# Patient Record
Sex: Male | Born: 1935 | Race: White | Hispanic: No | Marital: Married | Smoking: Former smoker
Health system: Southern US, Community
[De-identification: ages and names within clinical notes are randomized; demographics above are authoritative.]

## PROBLEM LIST (undated history)

## (undated) DIAGNOSIS — H9190 Unspecified hearing loss, unspecified ear: Secondary | ICD-10-CM

## (undated) DIAGNOSIS — M1990 Unspecified osteoarthritis, unspecified site: Secondary | ICD-10-CM

## (undated) DIAGNOSIS — F419 Anxiety disorder, unspecified: Secondary | ICD-10-CM

## (undated) DIAGNOSIS — I10 Essential (primary) hypertension: Secondary | ICD-10-CM

## (undated) DIAGNOSIS — K219 Gastro-esophageal reflux disease without esophagitis: Secondary | ICD-10-CM

## (undated) DIAGNOSIS — E785 Hyperlipidemia, unspecified: Secondary | ICD-10-CM

## (undated) DIAGNOSIS — I499 Cardiac arrhythmia, unspecified: Secondary | ICD-10-CM

## (undated) DIAGNOSIS — R55 Syncope and collapse: Secondary | ICD-10-CM

## (undated) DIAGNOSIS — Z950 Presence of cardiac pacemaker: Secondary | ICD-10-CM

## (undated) HISTORY — PX: TOOTH EXTRACTION: SUR596

## (undated) HISTORY — PX: CHOLECYSTECTOMY: SHX55

---

## 2003-10-22 ENCOUNTER — Other Ambulatory Visit: Payer: Self-pay

## 2005-05-06 ENCOUNTER — Other Ambulatory Visit: Payer: Self-pay

## 2005-05-06 ENCOUNTER — Emergency Department: Payer: Self-pay | Admitting: Unknown Physician Specialty

## 2005-09-14 ENCOUNTER — Emergency Department: Payer: Self-pay | Admitting: Emergency Medicine

## 2006-07-26 ENCOUNTER — Ambulatory Visit: Payer: Self-pay | Admitting: Gastroenterology

## 2006-08-25 ENCOUNTER — Ambulatory Visit: Payer: Self-pay | Admitting: Internal Medicine

## 2006-09-06 ENCOUNTER — Ambulatory Visit: Payer: Self-pay | Admitting: Gastroenterology

## 2007-04-12 ENCOUNTER — Ambulatory Visit: Payer: Self-pay | Admitting: Internal Medicine

## 2007-07-24 ENCOUNTER — Ambulatory Visit: Payer: Self-pay | Admitting: Unknown Physician Specialty

## 2007-08-06 ENCOUNTER — Ambulatory Visit: Payer: Self-pay | Admitting: Urology

## 2007-08-06 ENCOUNTER — Other Ambulatory Visit: Payer: Self-pay

## 2007-08-12 ENCOUNTER — Ambulatory Visit: Payer: Self-pay | Admitting: Urology

## 2009-03-08 ENCOUNTER — Inpatient Hospital Stay: Payer: Self-pay | Admitting: Internal Medicine

## 2009-03-08 ENCOUNTER — Ambulatory Visit: Payer: Self-pay | Admitting: Family Medicine

## 2009-03-26 ENCOUNTER — Ambulatory Visit: Payer: Self-pay | Admitting: Internal Medicine

## 2009-07-27 ENCOUNTER — Ambulatory Visit: Payer: Self-pay | Admitting: Internal Medicine

## 2011-10-18 ENCOUNTER — Ambulatory Visit: Payer: Self-pay | Admitting: Internal Medicine

## 2012-06-05 ENCOUNTER — Ambulatory Visit: Payer: Self-pay | Admitting: Ophthalmology

## 2014-03-22 ENCOUNTER — Emergency Department: Payer: Self-pay | Admitting: Emergency Medicine

## 2014-03-22 ENCOUNTER — Ambulatory Visit: Payer: Self-pay

## 2014-03-22 LAB — CBC WITH DIFFERENTIAL/PLATELET
Basophil #: 0 10*3/uL (ref 0.0–0.1)
Basophil %: 0.1 %
EOS ABS: 0 10*3/uL (ref 0.0–0.7)
Eosinophil %: 0 %
HCT: 40.5 % (ref 40.0–52.0)
HGB: 13.3 g/dL (ref 13.0–18.0)
Lymphocyte #: 0.6 10*3/uL — ABNORMAL LOW (ref 1.0–3.6)
Lymphocyte %: 2.2 %
MCH: 29.8 pg (ref 26.0–34.0)
MCHC: 32.8 g/dL (ref 32.0–36.0)
MCV: 91 fL (ref 80–100)
MONOS PCT: 5.2 %
Monocyte #: 1.3 x10 3/mm — ABNORMAL HIGH (ref 0.2–1.0)
Neutrophil #: 23 10*3/uL — ABNORMAL HIGH (ref 1.4–6.5)
Neutrophil %: 92.5 %
PLATELETS: 186 10*3/uL (ref 150–440)
RBC: 4.45 10*6/uL (ref 4.40–5.90)
RDW: 15 % — AB (ref 11.5–14.5)
WBC: 24.9 10*3/uL — AB (ref 3.8–10.6)

## 2014-03-22 LAB — HEPATIC FUNCTION PANEL A (ARMC)
ALBUMIN: 3.1 g/dL — AB (ref 3.4–5.0)
ALK PHOS: 123 U/L — AB
AST: 211 U/L — AB (ref 15–37)
Bilirubin, Direct: 2.4 mg/dL — ABNORMAL HIGH (ref 0.00–0.20)
Bilirubin,Total: 3.7 mg/dL — ABNORMAL HIGH (ref 0.2–1.0)
SGPT (ALT): 201 U/L — ABNORMAL HIGH (ref 12–78)
Total Protein: 7.4 g/dL (ref 6.4–8.2)

## 2014-03-22 LAB — URINALYSIS, COMPLETE: WBC UR: >30 /HPF (ref 0–5)

## 2014-03-22 LAB — BASIC METABOLIC PANEL
ANION GAP: 10 (ref 7–16)
BUN: 25 mg/dL — ABNORMAL HIGH (ref 7–18)
CALCIUM: 9 mg/dL (ref 8.5–10.1)
Chloride: 97 mmol/L — ABNORMAL LOW (ref 98–107)
Co2: 23 mmol/L (ref 21–32)
Creatinine: 2.42 mg/dL — ABNORMAL HIGH (ref 0.60–1.30)
EGFR (African American): 28 — ABNORMAL LOW
EGFR (Non-African Amer.): 24 — ABNORMAL LOW
GLUCOSE: 125 mg/dL — AB (ref 65–99)
OSMOLALITY: 267 (ref 275–301)
Potassium: 4.5 mmol/L (ref 3.5–5.1)
Sodium: 130 mmol/L — ABNORMAL LOW (ref 136–145)

## 2014-03-23 LAB — URINE CULTURE

## 2014-03-27 LAB — CULTURE, BLOOD (SINGLE)

## 2014-03-28 ENCOUNTER — Ambulatory Visit: Payer: Self-pay | Admitting: Emergency Medicine

## 2015-03-08 ENCOUNTER — Ambulatory Visit (INDEPENDENT_AMBULATORY_CARE_PROVIDER_SITE_OTHER)
Admission: EM | Admit: 2015-03-08 | Discharge: 2015-03-08 | Disposition: A | Discharge status: Home / Self Care | Payer: Medicare Other | Source: Home / Self Care | Attending: Family Medicine | Admitting: Family Medicine

## 2015-03-08 ENCOUNTER — Emergency Department
Admission: EM | Admit: 2015-03-08 | Discharge: 2015-03-08 | Disposition: A | Discharge status: Home / Self Care | Payer: Medicare Other | Attending: Emergency Medicine | Admitting: Emergency Medicine

## 2015-03-08 ENCOUNTER — Other Ambulatory Visit: Payer: Self-pay

## 2015-03-08 ENCOUNTER — Encounter: Payer: Self-pay | Admitting: General Practice

## 2015-03-08 ENCOUNTER — Emergency Department: Payer: Medicare Other

## 2015-03-08 DIAGNOSIS — Y998 Other external cause status: Secondary | ICD-10-CM | POA: Diagnosis not present

## 2015-03-08 DIAGNOSIS — W19XXXA Unspecified fall, initial encounter: Secondary | ICD-10-CM

## 2015-03-08 DIAGNOSIS — R55 Syncope and collapse: Secondary | ICD-10-CM | POA: Insufficient documentation

## 2015-03-08 DIAGNOSIS — Z87891 Personal history of nicotine dependence: Secondary | ICD-10-CM

## 2015-03-08 DIAGNOSIS — R42 Dizziness and giddiness: Secondary | ICD-10-CM

## 2015-03-08 DIAGNOSIS — Z791 Long term (current) use of non-steroidal anti-inflammatories (NSAID): Secondary | ICD-10-CM | POA: Diagnosis not present

## 2015-03-08 DIAGNOSIS — K219 Gastro-esophageal reflux disease without esophagitis: Secondary | ICD-10-CM

## 2015-03-08 DIAGNOSIS — Y9289 Other specified places as the place of occurrence of the external cause: Secondary | ICD-10-CM | POA: Insufficient documentation

## 2015-03-08 DIAGNOSIS — I10 Essential (primary) hypertension: Secondary | ICD-10-CM | POA: Insufficient documentation

## 2015-03-08 DIAGNOSIS — Y9389 Activity, other specified: Secondary | ICD-10-CM | POA: Insufficient documentation

## 2015-03-08 DIAGNOSIS — R7989 Other specified abnormal findings of blood chemistry: Secondary | ICD-10-CM | POA: Diagnosis not present

## 2015-03-08 DIAGNOSIS — E785 Hyperlipidemia, unspecified: Secondary | ICD-10-CM

## 2015-03-08 DIAGNOSIS — S01511A Laceration without foreign body of lip, initial encounter: Secondary | ICD-10-CM | POA: Insufficient documentation

## 2015-03-08 DIAGNOSIS — F419 Anxiety disorder, unspecified: Secondary | ICD-10-CM

## 2015-03-08 DIAGNOSIS — S0083XA Contusion of other part of head, initial encounter: Secondary | ICD-10-CM | POA: Diagnosis not present

## 2015-03-08 DIAGNOSIS — W01198A Fall on same level from slipping, tripping and stumbling with subsequent striking against other object, initial encounter: Secondary | ICD-10-CM | POA: Diagnosis not present

## 2015-03-08 DIAGNOSIS — Z79899 Other long term (current) drug therapy: Secondary | ICD-10-CM | POA: Insufficient documentation

## 2015-03-08 DIAGNOSIS — Z23 Encounter for immunization: Secondary | ICD-10-CM | POA: Insufficient documentation

## 2015-03-08 HISTORY — DX: Hyperlipidemia, unspecified: E78.5

## 2015-03-08 HISTORY — DX: Essential (primary) hypertension: I10

## 2015-03-08 HISTORY — DX: Anxiety disorder, unspecified: F41.9

## 2015-03-08 HISTORY — DX: Gastro-esophageal reflux disease without esophagitis: K21.9

## 2015-03-08 LAB — BASIC METABOLIC PANEL
Anion gap: 7 (ref 5–15)
BUN: 17 mg/dL (ref 6–20)
CALCIUM: 9.1 mg/dL (ref 8.9–10.3)
CO2: 29 mmol/L (ref 22–32)
Chloride: 99 mmol/L — ABNORMAL LOW (ref 101–111)
Creatinine, Ser: 1.53 mg/dL — ABNORMAL HIGH (ref 0.61–1.24)
GFR, EST AFRICAN AMERICAN: 47 mL/min — AB (ref >60)
GFR, EST NON AFRICAN AMERICAN: 40 mL/min — AB (ref >60)
Glucose, Bld: 110 mg/dL — ABNORMAL HIGH (ref 65–99)
POTASSIUM: 4.3 mmol/L (ref 3.5–5.1)
SODIUM: 135 mmol/L (ref 135–145)

## 2015-03-08 LAB — CBC
HEMATOCRIT: 42.4 % (ref 40.0–52.0)
Hemoglobin: 13.8 g/dL (ref 13.0–18.0)
MCH: 28 pg (ref 26.0–34.0)
MCHC: 32.5 g/dL (ref 32.0–36.0)
MCV: 86.3 fL (ref 80.0–100.0)
Platelets: 260 10*3/uL (ref 150–440)
RBC: 4.91 MIL/uL (ref 4.40–5.90)
RDW: 15.7 % — ABNORMAL HIGH (ref 11.5–14.5)
WBC: 11.6 10*3/uL — ABNORMAL HIGH (ref 3.8–10.6)

## 2015-03-08 LAB — TROPONIN I
TROPONIN I: 0.05 ng/mL — AB (ref <0.031)
Troponin I: 0.05 ng/mL — ABNORMAL HIGH (ref <0.031)

## 2015-03-08 MED ORDER — TETANUS-DIPHTH-ACELL PERTUSSIS 5-2.5-18.5 LF-MCG/0.5 IM SUSP
INTRAMUSCULAR | Status: AC
  Administered 2015-03-08: 0.5 mL via INTRAMUSCULAR
  Filled 2015-03-08: qty 0.5

## 2015-03-08 MED ORDER — LIDOCAINE-EPINEPHRINE (PF) 1 %-1:200000 IJ SOLN
INTRAMUSCULAR | Status: AC
  Filled 2015-03-08: qty 30

## 2015-03-08 MED ORDER — CHLORHEXIDINE GLUCONATE 0.12 % MT SOLN: 15.0000 mL | Freq: Two times a day (BID) | OROMUCOSAL | Status: DC

## 2015-03-08 MED ORDER — TETANUS-DIPHTHERIA TOXOIDS TD 5-2 LFU IM INJ: 0.5000 mL | INJECTION | Freq: Once | INTRAMUSCULAR | Status: AC

## 2015-03-08 MED ORDER — LIDOCAINE-EPINEPHRINE 2 %-1:100000 IJ SOLN
20.0000 mL | Freq: Once | INTRAMUSCULAR | Status: DC
  Filled 2015-03-08: qty 20

## 2015-03-08 NOTE — ED Notes (Signed)
Patient had a syncope episode this morning at home. Felt dizzy and passed out, fell face forward on hardwood floor, grandson helped him get up, lip laceration noted and patient reports left shoulder pain. Pt alert oriented x 4.

## 2015-03-08 NOTE — ED Notes (Signed)
Ambulance at bedside.

## 2015-03-08 NOTE — ED Notes (Signed)
Repeat troponin sent to lab to process.

## 2015-03-08 NOTE — ED Notes (Signed)
MD at bedside. 

## 2015-03-08 NOTE — Discharge Instructions (Signed)
Absorbable Suture Repair °Absorbable sutures (stitches) hold skin together so you can heal. Keep skin wounds clean and dry for the next 2 to 3 days. Then, you may gently wash your wound and dress it with an antibiotic ointment as recommended. As your wound begins to heal, the sutures are no longer needed, and they typically begin to fall off. This will take 7 to 10 days. After 10 days, if your sutures are loose, you can remove them by wiping with a clean gauze pad or a cotton ball. Do not pull your sutures out. They should wipe away easily. If after 10 days they do not easily wipe away, have your caregiver take them out. Absorbable sutures may be used deep in a wound to help hold it together. If these stitches are below the skin, the body will absorb them completely in 3 to 4 weeks.  °You may need a tetanus shot if: °· You cannot remember when you had your last tetanus shot. °· You have never had a tetanus shot. °If you get a tetanus shot, your arm may swell, get red, and feel warm to the touch. This is common and not a problem. If you need a tetanus shot and you choose not to have one, there is a rare chance of getting tetanus. Sickness from tetanus can be serious. °SEEK IMMEDIATE MEDICAL CARE IF: °· You have redness in the wound area. °· The wound area feels hot to the touch. °· You develop swelling in the wound area. °· You develop pain. °· There is fluid drainage from the wound. °Document Released: 11/16/2004 Document Revised: 01/01/2012 Document Reviewed: 02/28/2011 °ExitCare® Patient Information ©2015 [employer]. This information is not intended to replace advice given to you by your health care provider. Make sure you discuss any questions you have with your health care provider. ° °Facial Laceration °A facial laceration is a cut on the face. These injuries can be painful and cause bleeding. Some cuts may need to be closed with stitches (sutures), skin adhesive strips, or wound glue. Cuts usually heal  quickly but can leave a scar. It can take 1-2 years for the scar to go away completely. °HOME CARE  °· Only take medicines as told by your doctor. °· Follow your doctor's instructions for wound care. °For Stitches: °· Keep the cut clean and dry. °· If you have a bandage (dressing), change it at least once a day. Change the bandage if it gets wet or dirty, or as told by your doctor. °· Wash the cut with soap and water 2 times a day. Rinse the cut with water. Pat it dry with a clean towel. °· Put a thin layer of medicated cream on the cut as told by your doctor. °· You may shower after the first 24 hours. Do not soak the cut in water until the stitches are removed. °· Have your stitches removed as told by your doctor. °· Do not wear any makeup until a few days after your stitches are removed. °For Skin Adhesive Strips: °· Keep the cut clean and dry. °· Do not get the strips wet. You may take a bath, but be careful to keep the cut dry. °· If the cut gets wet, pat it dry with a clean towel. °· The strips will fall off on their own. Do not remove the strips that are still stuck to the cut. °For Wound Glue: °· You may shower or take baths. Do not soak or scrub the cut. Do   not swim. Avoid heavy sweating until the glue falls off on its own. After a shower or bath, pat the cut dry with a clean towel.  Do not put medicine or makeup on your cut until the glue falls off.  If you have a bandage, do not put tape over the glue.  Avoid lots of sunlight or tanning lamps until the glue falls off.  The glue will fall off on its own in 5-10 days. Do not pick at the glue. After Healing: Put sunscreen on the cut for the first year to reduce your scar. GET HELP RIGHT AWAY IF:   Your cut area gets red, painful, or puffy (swollen).  You see a yellowish-white fluid (pus) coming from the cut.  You have chills or a fever. MAKE SURE YOU:   Understand these instructions.  Will watch your condition.  Will get help right  away if you are not doing well or get worse. Document Released: 03/27/2008 Document Revised: 07/30/2013 Document Reviewed: 05/22/2013 [facility] Patient Information 2015 [employer], [place]. This information is not intended to replace advice given to you by your health care provider. Make sure you discuss any questions you have with your health care provider.  Syncope Syncope is a medical term for fainting or passing out. This means you lose consciousness and drop to the ground. People are generally unconscious for less than 5 minutes. You may have some muscle twitches for up to 15 seconds before waking up and returning to normal. Syncope occurs more often in older adults, but it can happen to anyone. While most causes of syncope are not dangerous, syncope can be a sign of a serious medical problem. It is important to seek medical care.  CAUSES  Syncope is caused by a sudden drop in blood flow to the brain. The specific cause is often not determined. Factors that can bring on syncope include:  Taking medicines that lower blood pressure.  Sudden changes in posture, such as standing up quickly.  Taking more medicine than prescribed.  Standing in one place for too long.  Seizure disorders.  Dehydration and excessive exposure to heat.  Low blood sugar (hypoglycemia).  Straining to have a bowel movement.  Heart disease, irregular heartbeat, or other circulatory problems.  Fear, emotional distress, seeing blood, or severe pain. SYMPTOMS  Right before fainting, you may:  Feel dizzy or light-headed.  Feel nauseous.  See all white or all black in your field of vision.  Have cold, clammy skin. DIAGNOSIS  Your health care provider will ask about your symptoms, perform a physical exam, and perform an electrocardiogram (ECG) to record the electrical activity of your heart. Your health care provider may also perform other heart or blood tests to determine the cause of your syncope which may  include:  Transthoracic echocardiogram (TTE). During echocardiography, sound waves are used to evaluate how blood flows through your heart.  Transesophageal echocardiogram (TEE).  Cardiac monitoring. This allows your health care provider to monitor your heart rate and rhythm in real time.  Holter monitor. This is a portable device that records your heartbeat and can help diagnose heart arrhythmias. It allows your health care provider to track your heart activity for several days, if needed.  Stress tests by exercise or by giving medicine that makes the heart beat faster. TREATMENT  In most cases, no treatment is needed. Depending on the cause of your syncope, your health care provider may recommend changing or stopping some of your medicines. HOME CARE INSTRUCTIONS  Have someone stay with you until you feel stable.  Do not drive, use machinery, or play sports until your health care provider says it is okay.  Keep all follow-up appointments as directed by your health care provider.  Lie down right away if you start feeling like you might faint. Breathe deeply and steadily. Wait until all the symptoms have passed.  Drink enough fluids to keep your urine clear or pale yellow.  If you are taking blood pressure or heart medicine, get up slowly and take several minutes to sit and then stand. This can reduce dizziness. SEEK IMMEDIATE MEDICAL CARE IF:   You have a severe headache.  You have unusual pain in the chest, abdomen, or back.  You are bleeding from your mouth or rectum, or you have black or tarry stool.  You have an irregular or very fast heartbeat.  You have pain with breathing.  You have repeated fainting or seizure-like jerking during an episode.  You faint when sitting or lying down.  You have confusion.  You have trouble walking.  You have severe weakness.  You have vision problems. If you fainted, call your local emergency services (911 in [place] Do not drive  yourself to the hospital.  MAKE SURE YOU:  Understand these instructions.  Will watch your condition.  Will get help right away if you are not doing well or get worse. Document Released: 10/09/2005 Document Revised: 10/14/2013 Document Reviewed: 12/08/2011 [facility] Patient Information 2015 [place]ExitCare, [place]. This information is not intended to replace advice given to you by your health care provider. Make sure you discuss any questions you have with your health care provider.

## 2015-03-08 NOTE — ED Provider Notes (Signed)
[facility] Emergency Department Provider Note  ____________________________________________  Time seen: 2:00 PM  I have reviewed the triage vital signs and the nursing notes.   HISTORY  Chief Complaint Loss of Consciousness and Facial Laceration    HPI Phillip Robinson is a 81 y.o. male who was in the process of preparing his breakfast this morning when he was feeling a little bit dizzy while seated. He then stood up to get toast and got more dizzy and passed out. As he fell he hit the front of his face on the countertop. Denies any chest pain, shortness of breath or other symptoms. He was otherwise in his usual state of health prior to the dizziness and fall. He does have a history of syncope in the past which is been worked up with hospitalizations, most recently 2 months ago at [facility]. Per the patient and wife, they have not yet found any cause for the syncope and he has not been diagnosed with any cardiac issues. No numbness, tingling or weakness, vision changes, or change in balance or coordination  He does complain of pain in the upper lip where there is a laceration   Past Medical History  Diagnosis Date  . Hyperlipidemia   . Anxiety   . Hypertension   . GERD (gastroesophageal reflux disease)     There are no active problems to display for this patient.   No past surgical history on file.  Current Outpatient Rx  Name  Route  Sig  Dispense  Refill  . amLODipine (NORVASC) 5 MG tablet   Oral   Take 5 mg by mouth daily.         . cetirizine (ZYRTEC) 10 MG tablet   Oral   Take 10 mg by mouth daily.         . divalproex (DEPAKOTE) 125 MG DR tablet   Oral   Take 125 mg by mouth 2 (two) times daily.         Marland Kitchen esomeprazole (NEXIUM) 20 MG capsule   Oral   Take 20 mg by mouth daily at 12 noon.         . gabapentin (NEURONTIN) 100 MG capsule   Oral   Take 100 mg by mouth 2 (two) times daily.         Marland Kitchen LORazepam (ATIVAN) 0.5 MG tablet    Oral   Take 0.5 mg by mouth every 6 (six) hours as needed for anxiety.         . meloxicam (MOBIC) 15 MG tablet   Oral   Take 15 mg by mouth daily.         . simvastatin (ZOCOR) 40 MG tablet   Oral   Take 40 mg by mouth daily.           Allergies Review of patient's allergies indicates no known allergies.  No family history on file.  Social History History  Substance Use Topics  . Smoking status: Former Games developer  . Smokeless tobacco: Not on file  . Alcohol Use: No    Review of Systems  Constitutional: No fever or chills. No weight changes Eyes:No blurry vision or double vision.  ENT: No sore throat. Cardiovascular: No chest pain. Respiratory: No dyspnea or cough. Gastrointestinal: Negative for abdominal pain, vomiting and diarrhea.  No BRBPR or melena. Genitourinary: Negative for dysuria, urinary retention, bloody urine, or difficulty urinating. Musculoskeletal: Negative for back pain. No joint swelling or pain. Skin: Negative for rash. Neurological: Negative  for headaches, focal weakness or numbness. Dizziness as above Psychiatric:No anxiety or depression.   Endocrine:No hot/cold intolerance, changes in energy, or sleep difficulty.  10-point ROS otherwise negative.  ____________________________________________   PHYSICAL EXAM:  VITAL SIGNS: ED Triage Vitals  Enc Vitals Group     BP 03/08/15 1217 144/84 mmHg     Pulse Rate 03/08/15 1217 82     Resp 03/08/15 1217 18     Temp 03/08/15 1217 97.4 F (36.3 C)     Temp Source 03/08/15 1217 Oral     SpO2 03/08/15 1217 97 %     Weight 03/08/15 1217 165 lb (74.844 kg)     Height 03/08/15 1217 5\' 7"  (1.702 m)     Head Cir --      Peak Flow --      Pain Score 03/08/15 1218 5     Pain Loc --      Pain Edu? --      Excl. in GC? --      Constitutional: Alert and oriented. Well appearing and in no distress. Eyes: No scleral icterus. No conjunctival pallor. PERRL. EOMI ENT   Head: Normocephalic with a  3 cm laceration of the left upper lip through the vermilion border and into the intraoral lip   Nose: No congestion/rhinnorhea. No septal hematoma   Mouth/Throat: MMM, no pharyngeal erythema. No peritonsillar mass. No uvula shift.   Neck: No stridor. No SubQ emphysema. No meningismus. Mild tenderness in cervical spine around C5 Hematological/Lymphatic/Immunilogical: No cervical lymphadenopathy. Cardiovascular: RRR. Normal and symmetric distal pulses are present in all extremities. No murmurs, rubs, or gallops. Respiratory: Normal respiratory effort without tachypnea nor retractions. Breath sounds are clear and equal bilaterally. No wheezes/rales/rhonchi. Gastrointestinal: Soft and nontender. No distention. There is no CVA tenderness.  No rebound, rigidity, or guarding. Genitourinary: deferred Musculoskeletal: Nontender with normal range of motion in all extremities. No joint effusions.  No lower extremity tenderness.  No edema. Neurologic:   Normal speech and language.  CN 2-10 normal. Motor grossly intact. No pronator drift.  Normal gait. No gross focal neurologic deficits are appreciated.  Skin:  Skin is warm, dry and intact. No rash noted.  No petechiae, purpura, or bullae. Psychiatric: Mood and affect are normal. Speech and behavior are normal. Patient exhibits appropriate insight and judgment.  ____________________________________________    LABS (pertinent positives/negatives) (all labs ordered are listed, but only abnormal results are displayed) Labs Reviewed  CBC - Abnormal; Notable for the following:    WBC 11.6 (*)    RDW 15.7 (*)    All other components within normal limits  BASIC METABOLIC PANEL - Abnormal; Notable for the following:    Chloride 99 (*)    Glucose, Bld 110 (*)    Creatinine, Ser 1.53 (*)    GFR calc non Af Amer 40 (*)    GFR calc Af Amer 47 (*)    All other components within normal limits  TROPONIN I - Abnormal; Notable for the following:     Troponin I 0.05 (*)    All other components within normal limits  TROPONIN I   ____________________________________________   EKG  Sinus rhythm rate 84. There is one PAC and a 6 second strip. Left axis, prolonged QRS at 1:30 milliseconds, right bundle branch block. No ischemic changes of ST segments or T waves  ____________________________________________    RADIOLOGY  CT head and cervical spine unremarkable  ____________________________________________   PROCEDURES LACERATION REPAIR Performed by: Sharman CheekSTAFFORD, Milianna Ericsson Authorized  by: Fain Francis Consent: Verbal consent obtained. Risks and benefits: risks, benefits and alternatives were discussed Consent given by: patient Patient identity confirmed: provided demographic data Prepped and Draped in normal sterile fashion Wound explored  Laceration Location: Left upper lip  Laceration Length: 3 cm  No Foreign Bodies seen or palpated  Anesthesia: Regional infraorbital block Local anesthetic: lidocaine 2 % with epinephrine  Anesthetic total: 2 ml  Irrigation method: syringe Amount of cleaning: standard  Skin closure: Monocryl   Number of sutures: 2   Technique: Simple interrupted  Vermilion border repaired and aligned   Patient tolerance: Patient tolerated the procedure well with no immediate complications.  ____________________________________________   INITIAL IMPRESSION / ASSESSMENT AND PLAN / ED COURSE  Pertinent labs & imaging results that were available during my care of the patient were reviewed by me and considered in my medical decision making (see chart for details).  Patient presents with dizziness causing syncope, and minor head trauma resulting in lip laceration. Check labs and CT of the head, neck, given a tetanus shot, as the patient is unable to recall his last one, and do wound care and laceration repair.  ----------------------------------------- 3:41 PM on  03/08/2015 -----------------------------------------  Initial troponin slightly elevated at 0.05. Patient offered admission to rule out cardiac cause due to his dizziness and syncope. However, patient and wife refuses. They've been through this multiple times before and feel that it is unlikely to yield for the results. We therefore agreed to repeat the troponin at a 3 hour interval and if it was not increasing significantly, the patient would be discharged home to follow up with cardiology. I completed a laceration repair, see laceration note for further details. Vital signs have remained unremarkable. While in the ED. ----------------------------------------- 5:09 PM on 03/08/2015 -----------------------------------------  Repeat troponin unchanged. Low suspicion ACS at this time. Will follow-up with cardiology for further evaluation. Vital signs stable. No evidence on telemetry no distress. ____________________________________________   FINAL CLINICAL IMPRESSION(S) / ED DIAGNOSES  Final diagnoses:  None   dizziness. Syncope. Head trauma with lip laceration involving vermilion border Mildly elevated troponin    Sharman CheekPhillip Emiel Kielty, MD 03/08/15 [phone]

## 2015-03-08 NOTE — ED Notes (Signed)
CRITICAL VALUE ALERT  Critical value received:  Troponin   Date of notification:  03/08/15  Time of notification:  1314  Critical value read back:Yes.    Nurse who received alert:  Lennice SitesJill RN   MD notified (1st page):  Dr. Scotty CourtStafford   Time of first page:  n/a  MD notified (2nd page): n/a  Time of second page: n/a  Responding MD:  Dr. Scotty CourtStafford   Time MD responded:  [id number]

## 2015-03-08 NOTE — ED Notes (Signed)
[facility] Reports that pt experienced a syncope episode today and hit head on floor. Pt. Went to urgent care but was sent for further evaluation. PT alert and oriented x three on arrival. PT repots painful lip, laceration noted to top lip, and back ache. Bleeding control at this time.

## 2015-03-08 NOTE — ED Notes (Signed)
[facility]

## 2015-03-08 NOTE — ED Provider Notes (Signed)
CSN: [id number]     Arrival date & time 03/08/15  1020 History   First MD Initiated Contact with Patient 03/08/15 1038     Chief Complaint  Patient presents with  . Loss of Consciousness   (Consider location/radiation/quality/duration/timing/severity/associated sxs/prior Treatment) Patient is a 81 y.o. male presenting with syncope. The history is provided by the patient and the spouse. The history is limited by the condition of the patient.  Loss of Consciousness Episode history:  Multiple Most recent episode:  Today Timing:  Constant Progression:  Partially resolved Chronicity:  Chronic Context: normal activity and standing up   Context: not blood draw, not inactivity and not sitting down   Witnessed: yes   Relieved by:  Nothing Worsened by:  Nothing tried Associated symptoms: recent fall and recent injury   Risk factors comment:  Abdominal EKG and history of vagal reactions   Past Medical History  Diagnosis Date  . Hyperlipidemia   . Anxiety   . Hypertension   . GERD (gastroesophageal reflux disease)    No past surgical history on file. No family history on file. History  Substance Use Topics  . Smoking status: Former Games developermoker  . Smokeless tobacco: Not on file  . Alcohol Use: No    Review of Systems  HENT: Positive for facial swelling.   Cardiovascular: Positive for syncope.  Gastrointestinal: Positive for abdominal distention.       Spouse reports that he has vagal episodes from gastric distention.  Musculoskeletal: Positive for myalgias.       Reports having right shoulder pain  Neurological: Positive for syncope.   Patient was brought here after experiencing a syncopal episode at home. According to his wife he was walking when he went out apparently his face on furniture at home resulting in a bloody lip bloody mouth he's also complaining of left shoulder pain as well. His wife reports that he's had a history of syncopal episodes in the last time he [facility].  His wife reports that he was out for several minutes but this is usual for him. Patient is not very talkative at this time. The patient's wife indicates they want the treatment here. [facility] and they do not want to go to [facility].  Further discussion now the patient and his wife indicate that they may be willing to go to [facility].  Allergies  Review of patient's allergies indicates no known allergies.  Home Medications   Prior to Admission medications   Medication Sig Start Date End Date Taking? Authorizing Provider  amLODipine (NORVASC) 5 MG tablet Take 5 mg by mouth daily.   Yes Historical Provider, MD  cetirizine (ZYRTEC) 10 MG tablet Take 10 mg by mouth daily.   Yes Historical Provider, MD  divalproex (DEPAKOTE) 125 MG DR tablet Take 125 mg by mouth 2 (two) times daily.   Yes Historical Provider, MD  esomeprazole (NEXIUM) 20 MG capsule Take 20 mg by mouth daily.    Yes Historical Provider, MD  gabapentin (NEURONTIN) 100 MG capsule Take 100 mg by mouth 2 (two) times daily.   Yes Historical Provider, MD  LORazepam (ATIVAN) 0.5 MG tablet Take 0.5 mg by mouth every 6 (six) hours as needed for anxiety.   Yes Historical Provider, MD  meloxicam (MOBIC) 15 MG tablet Take 15 mg by mouth daily.   Yes Historical Provider, MD  simvastatin (ZOCOR) 40 MG tablet Take 40 mg by mouth daily.   Yes Historical Provider,  MD  chlorhexidine (PERIDEX) 0.12 % solution Use as directed 15 mLs in the mouth or throat 2 (two) times daily. 03/08/15   Sharman CheekPhillip Stafford, MD   BP 138/76 mmHg  Pulse 78  Temp(Src) 97.7 F (36.5 C) (Tympanic)  Resp 16  SpO2 96% Physical Exam  Constitutional: He appears distressed.  HENT:  Head: Normocephalic. Head is with abrasion, with contusion and with laceration.  Mouth/Throat:    Eyes: Pupils are equal, round, and reactive to light.  Cardiovascular: Normal rate and regular rhythm.   Pulmonary/Chest: Effort normal. No  accessory muscle usage. No respiratory distress.  Neurological:   His speech is very soft and passive this far as answering questions. His wife answers most of his questions.  Skin: Skin is warm and dry.  Vitals reviewed.  According to his wife patient patient is oriented. In a soft voice he can tell me the president and where he is at. According to his wife he poor. good hearing.  ED Course  Procedures (including critical care time) Labs Review Labs Reviewed - No data to display Patient and his spouse was informed that with his facial injury that he has suffered abnormal EKG showing both a right bundle branch block and a left bundle branch block or bifascicular block and even though he's had a history of syncopal episodes with the lack of responsiveness that he showing now I recommend that he be transferred to hospital for further evaluation. She is going to talk to her daughter. Imaging Review Ct Head Wo Contrast  03/08/2015   CLINICAL DATA:  Syncope this morning with dizziness and a fall.  EXAM: CT HEAD WITHOUT CONTRAST  CT CERVICAL SPINE WITHOUT CONTRAST  TECHNIQUE: Multidetector CT imaging of the head and cervical spine was performed following the standard protocol without intravenous contrast. Multiplanar CT image reconstructions of the cervical spine were also generated.  COMPARISON:  Brain MRI 10/18/2011.  FINDINGS: CT HEAD FINDINGS  Cortical atrophy is noted. No evidence of acute abnormality including hemorrhage, infarct, mass lesion, mass effect, midline shift or abnormal extra-axial fluid collection is identified. Remote lacunar infarction right basal ganglia is noted. The calvarium is intact. Mild mucosal thickening is seen in the left frontal sinus. Imaged paranasal sinuses and mastoid air cells are otherwise clear. The patient is status post ethmoidectomy.  CT CERVICAL SPINE FINDINGS  No cervical spine fracture is identified. Loss of disc space height and endplate spurring are most  notable it C6-7. Trace anterolisthesis C3 on C4 and 0.2 cm anterolisthesis C5 on C6 due to facet degenerative change.  IMPRESSION: No acute abnormality head or cervical spine.  Cortical atrophy.  Cervical spondylosis with.   Electronically Signed   By: Drusilla Kannerhomas  Dalessio M.D.   On: 03/08/2015 13:23   Ct Cervical Spine Wo Contrast  03/08/2015   CLINICAL DATA:  Syncope this morning with dizziness and a fall.  EXAM: CT HEAD WITHOUT CONTRAST  CT CERVICAL SPINE WITHOUT CONTRAST  TECHNIQUE: Multidetector CT imaging of the head and cervical spine was performed following the standard protocol without intravenous contrast. Multiplanar CT image reconstructions of the cervical spine were also generated.  COMPARISON:  Brain MRI 10/18/2011.  FINDINGS: CT HEAD FINDINGS  Cortical atrophy is noted. No evidence of acute abnormality including hemorrhage, infarct, mass lesion, mass effect, midline shift or abnormal extra-axial fluid collection is identified. Remote lacunar infarction right basal ganglia is noted. The calvarium is intact. Mild mucosal thickening is seen in the left frontal sinus. Imaged paranasal sinuses and mastoid  air cells are otherwise clear. The patient is status post ethmoidectomy.  CT CERVICAL SPINE FINDINGS  No cervical spine fracture is identified. Loss of disc space height and endplate spurring are most notable it C6-7. Trace anterolisthesis C3 on C4 and 0.2 cm anterolisthesis C5 on C6 due to facet degenerative change.  IMPRESSION: No acute abnormality head or cervical spine.  Cortical atrophy.  Cervical spondylosis with.   Electronically Signed   By: Drusilla Kanner M.D.   On: 03/08/2015 13:23     MDM   1. Syncopal vertigo   2. Facial contusion, initial encounter        Hassan Rowan, MD 03/08/15 2054

## 2015-03-08 NOTE — ED Notes (Signed)
Patient and family member informed that patient will be transported to the nearby emergency department for further evaluation of his syncope episode. Pt acknowledged and denied any questions. Emergency Services has been call. Pending for transfer

## 2015-03-19 ENCOUNTER — Ambulatory Visit (INDEPENDENT_AMBULATORY_CARE_PROVIDER_SITE_OTHER): Payer: Medicare Other | Admitting: Cardiovascular Disease

## 2015-03-19 ENCOUNTER — Encounter: Payer: Self-pay | Admitting: Cardiovascular Disease

## 2015-03-19 VITALS — BP 126/78 | HR 73 | Ht 67.0 in | Wt 149.0 lb

## 2015-03-19 DIAGNOSIS — R55 Syncope and collapse: Secondary | ICD-10-CM

## 2015-03-19 DIAGNOSIS — I10 Essential (primary) hypertension: Secondary | ICD-10-CM | POA: Diagnosis not present

## 2015-03-19 NOTE — Patient Instructions (Addendum)
Medication Instructions:  Your physician recommends that you continue on your current medications as directed. Please refer to the Current Medication list given to you today.   Labwork: None  Testing/Procedures: Your physician has recommended that you wear an event monitor. Event monitors are medical devices that record the heart's electrical activity. Doctors most often us these monitors to diagnose arrhythmias. Arrhythmias are problems with the speed or rhythm of the heartbeat. The monitor is a small, portable device. You can wear one while you do your normal daily activities. This is usually used to diagnose what is causing palpitations/syncope (passing out).  Your physician has requested that you have an echocardiogram. Echocardiography is a painless test that uses sound waves to create images of your heart. It provides your doctor with information about the size and shape of your heart and how well your heart's chambers and valves are working. This procedure takes approximately one hour. There are no restrictions for this procedure.    Follow-Up: Your physician recommends that you schedule a follow-up appointment in: six weeks with Dr. Kirke CorinArida.   Any Other Special Instructions Will Be Listed Below (If Applicable).

## 2015-03-19 NOTE — Assessment & Plan Note (Signed)
The patient presents with recurrent syncopal episodes. He is mildly orthostatic. However, the description of the episodes is not suggestive of orthostatic hypotension. The history is worrisome for recurrent bradycardia arrhythmic events especially with his baseline abnormal EKG showing bifascicular block. Due to that, I requested a 30 day outpatient telemetry. I also requested an echocardiogram to ensure no structural heart abnormalities. He has no symptoms suggestive of angina.  Patient is to follow-up after cardiac workup.

## 2015-03-19 NOTE — Assessment & Plan Note (Signed)
Blood pressure is controlled on current medications. 

## 2015-03-19 NOTE — Progress Notes (Signed)
Primary care physician: Dr. Yates Decamp  HPI  This is a pleasant 81 year old male who was referred for evaluation of recurrent syncope. The patient is accompanied by his wife. He is not aware of any previous cardiac history. He has known history of hypertension and hyperlipidemia. He reports prolonged history of recurrent syncopal episodes for a few years which has worsened recently. His wife reports that the patient used to pass out in the chair but the episodes were very infrequent. However, over the last month he had 3 syncopal episodes. The first episode happened while he was sitting in the chair. He was sweating profusely and was cold. The episode lasted for about 1 minute. He had a second episode while he was in the kitchen. It happened while he was walking to get and toast. All of a sudden, he had loss of consciousness and felt with subsequent lip laceration and neck contusion. He went to the emergency room at [facility]. His ECG showed bifascicular block. Troponin was only borderline elevated. The patient was not admitted. He had a third episode while he was sitting in the chair and was associated with vomiting. The patient is hard of hearing. He denies any chest pain or shortness of breath.  Allergies  Allergen Reactions  . Sulfa Antibiotics     Other reaction(s): Unknown     Current Outpatient Prescriptions on File Prior to Visit  Medication Sig Dispense Refill  . amLODipine (NORVASC) 5 MG tablet Take 5 mg by mouth daily.    . cetirizine (ZYRTEC) 10 MG tablet Take 10 mg by mouth daily.    . chlorhexidine (PERIDEX) 0.12 % solution Use as directed 15 mLs in the mouth or throat 2 (two) times daily. 120 mL 0  . divalproex (DEPAKOTE) 125 MG DR tablet Take 125 mg by mouth 2 (two) times daily.    Marland Kitchen esomeprazole (NEXIUM) 20 MG capsule Take 20 mg by mouth daily.     Marland Kitchen gabapentin (NEURONTIN) 100 MG capsule Take 100 mg by mouth 2 (two) times daily.    Marland Kitchen LORazepam (ATIVAN) 0.5 MG tablet Take 0.5 mg  by mouth every 6 (six) hours as needed for anxiety.    . meloxicam (MOBIC) 15 MG tablet Take 15 mg by mouth daily.    . simvastatin (ZOCOR) 40 MG tablet Take 40 mg by mouth daily.     No current facility-administered medications on file prior to visit.     Past Medical History  Diagnosis Date  . Hyperlipidemia   . Anxiety   . Hypertension   . GERD (gastroesophageal reflux disease)      Past Surgical History  Procedure Laterality Date  . Cholecystectomy       Family History  Problem Relation Age of Onset  . Family history unknown: Yes     History   Social History  . Marital Status: Married    Spouse Name: N/A  . Number of Children: N/A  . Years of Education: N/A   Occupational History  . Not on file.   Social History Main Topics  . Smoking status: Former Games developer  . Smokeless tobacco: Not on file  . Alcohol Use: No  . Drug Use: No  . Sexual Activity: Not on file   Other Topics Concern  . Not on file   Social History Narrative     ROS A 10 point review of system was performed. It is negative other than that mentioned in the history of present illness.   PHYSICAL  EXAM   BP 126/78 mmHg  Pulse 73  Ht 5\' 7"  (1.702 m)  Wt 149 lb (67.586 kg)  BMI 23.33 kg/m2 Constitutional: He is oriented to person, place, and time. He appears well-developed and well-nourished. No distress.  HENT: No nasal discharge.  Head: Normocephalic and atraumatic.  Eyes: Pupils are equal and round.  No discharge. Neck: Normal range of motion. Neck supple. No JVD present. No thyromegaly present.  Cardiovascular: Normal rate, regular rhythm, normal heart sounds. Exam reveals no gallop and no friction rub. No murmur heard.  Pulmonary/Chest: Effort normal and breath sounds normal. No stridor. No respiratory distress. He has no wheezes. He has no rales. He exhibits no tenderness.  Abdominal: Soft. Bowel sounds are normal. He exhibits no distension. There is no tenderness. There is no  rebound and no guarding.  Musculoskeletal: Normal range of motion. He exhibits no edema and no tenderness.  Neurological: He is alert and oriented to person, place, and time. Coordination normal.  Skin: Skin is warm and dry. No rash noted. He is not diaphoretic. No erythema. No pallor.  Psychiatric: He has a normal mood and affect. His behavior is normal. Judgment and thought content normal.       EKG: Recent EKG showed normal sinus rhythm with right bundle branch block and left anterior fascicular block.   ASSESSMENT AND PLAN

## 2015-03-26 ENCOUNTER — Ambulatory Visit (INDEPENDENT_AMBULATORY_CARE_PROVIDER_SITE_OTHER): Payer: Medicare Other

## 2015-03-26 ENCOUNTER — Other Ambulatory Visit: Payer: Self-pay

## 2015-03-26 DIAGNOSIS — R55 Syncope and collapse: Secondary | ICD-10-CM

## 2015-04-02 ENCOUNTER — Other Ambulatory Visit: Payer: Self-pay

## 2015-04-02 ENCOUNTER — Inpatient Hospital Stay (HOSPITAL_COMMUNITY)
Admission: AD | Admit: 2015-04-02 | Discharge: 2015-04-06 | DRG: 244 | Disposition: A | Discharge status: Home / Self Care | Payer: Medicare Other | Source: Other Acute Inpatient Hospital | Attending: Dermatopathology | Admitting: Dermatopathology

## 2015-04-02 ENCOUNTER — Emergency Department: Payer: Medicare Other

## 2015-04-02 ENCOUNTER — Encounter: Payer: Self-pay | Admitting: Emergency Medicine

## 2015-04-02 ENCOUNTER — Emergency Department
Admission: EM | Admit: 2015-04-02 | Discharge: 2015-04-02 | Payer: Medicare Other | Attending: Emergency Medicine | Admitting: Emergency Medicine

## 2015-04-02 DIAGNOSIS — Z79899 Other long term (current) drug therapy: Secondary | ICD-10-CM | POA: Diagnosis not present

## 2015-04-02 DIAGNOSIS — H9190 Unspecified hearing loss, unspecified ear: Secondary | ICD-10-CM | POA: Diagnosis present

## 2015-04-02 DIAGNOSIS — Z87891 Personal history of nicotine dependence: Secondary | ICD-10-CM

## 2015-04-02 DIAGNOSIS — Z882 Allergy status to sulfonamides status: Secondary | ICD-10-CM

## 2015-04-02 DIAGNOSIS — Z8249 Family history of ischemic heart disease and other diseases of the circulatory system: Secondary | ICD-10-CM | POA: Diagnosis not present

## 2015-04-02 DIAGNOSIS — R55 Syncope and collapse: Secondary | ICD-10-CM | POA: Diagnosis present

## 2015-04-02 DIAGNOSIS — I459 Conduction disorder, unspecified: Secondary | ICD-10-CM | POA: Diagnosis not present

## 2015-04-02 DIAGNOSIS — Z959 Presence of cardiac and vascular implant and graft, unspecified: Secondary | ICD-10-CM | POA: Diagnosis not present

## 2015-04-02 DIAGNOSIS — I455 Other specified heart block: Secondary | ICD-10-CM | POA: Insufficient documentation

## 2015-04-02 DIAGNOSIS — R001 Bradycardia, unspecified: Secondary | ICD-10-CM | POA: Diagnosis not present

## 2015-04-02 DIAGNOSIS — K219 Gastro-esophageal reflux disease without esophagitis: Secondary | ICD-10-CM | POA: Diagnosis present

## 2015-04-02 DIAGNOSIS — I495 Sick sinus syndrome: Secondary | ICD-10-CM | POA: Diagnosis not present

## 2015-04-02 DIAGNOSIS — E785 Hyperlipidemia, unspecified: Secondary | ICD-10-CM | POA: Diagnosis present

## 2015-04-02 DIAGNOSIS — I452 Bifascicular block: Secondary | ICD-10-CM | POA: Diagnosis present

## 2015-04-02 DIAGNOSIS — I10 Essential (primary) hypertension: Secondary | ICD-10-CM | POA: Insufficient documentation

## 2015-04-02 DIAGNOSIS — I442 Atrioventricular block, complete: Secondary | ICD-10-CM | POA: Diagnosis present

## 2015-04-02 DIAGNOSIS — F419 Anxiety disorder, unspecified: Secondary | ICD-10-CM | POA: Diagnosis present

## 2015-04-02 HISTORY — DX: Unspecified osteoarthritis, unspecified site: M19.90

## 2015-04-02 HISTORY — DX: Cardiac arrhythmia, unspecified: I49.9

## 2015-04-02 HISTORY — DX: Syncope and collapse: R55

## 2015-04-02 HISTORY — DX: Presence of cardiac pacemaker: Z95.0

## 2015-04-02 HISTORY — DX: Unspecified hearing loss, unspecified ear: H91.90

## 2015-04-02 LAB — CBC
HCT: 39.2 % — ABNORMAL LOW (ref 40.0–52.0)
HCT: 38.3 % — ABNORMAL LOW (ref 39.0–52.0)
HEMOGLOBIN: 12.6 g/dL — AB (ref 13.0–18.0)
Hemoglobin: 12.7 g/dL — ABNORMAL LOW (ref 13.0–17.0)
MCH: 28 pg (ref 26.0–34.0)
MCH: 28.5 pg (ref 26.0–34.0)
MCHC: 32.3 g/dL (ref 32.0–36.0)
MCHC: 33.2 g/dL (ref 30.0–36.0)
MCV: 86.8 fL (ref 80.0–100.0)
MCV: 86.1 fL (ref 78.0–100.0)
PLATELETS: 266 10*3/uL (ref 150–440)
Platelets: 248 K/uL (ref 150–400)
RBC: 4.52 MIL/uL (ref 4.40–5.90)
RBC: 4.45 MIL/uL (ref 4.22–5.81)
RDW: 15.6 % — ABNORMAL HIGH (ref 11.5–14.5)
RDW: 14.9 % (ref 11.5–15.5)
WBC: 13.1 10*3/uL — AB (ref 3.8–10.6)
WBC: 15.5 K/uL — ABNORMAL HIGH (ref 4.0–10.5)

## 2015-04-02 LAB — BASIC METABOLIC PANEL
Anion gap: 9 (ref 5–15)
BUN: 18 mg/dL (ref 6–20)
CALCIUM: 9.1 mg/dL (ref 8.9–10.3)
CHLORIDE: 100 mmol/L — AB (ref 101–111)
CO2: 27 mmol/L (ref 22–32)
Creatinine, Ser: 1.5 mg/dL — ABNORMAL HIGH (ref 0.61–1.24)
GFR calc Af Amer: 48 mL/min — ABNORMAL LOW (ref >60)
GFR calc non Af Amer: 41 mL/min — ABNORMAL LOW (ref >60)
GLUCOSE: 114 mg/dL — AB (ref 65–99)
Potassium: 3.8 mmol/L (ref 3.5–5.1)
Sodium: 136 mmol/L (ref 135–145)

## 2015-04-02 LAB — CREATININE, SERUM
CREATININE: 1.39 mg/dL — AB (ref 0.61–1.24)
GFR, EST AFRICAN AMERICAN: 52 mL/min — AB (ref >60)
GFR, EST NON AFRICAN AMERICAN: 45 mL/min — AB (ref >60)

## 2015-04-02 LAB — TROPONIN I: Troponin I: 0.03 ng/mL (ref <0.031)

## 2015-04-02 LAB — MRSA PCR SCREENING: MRSA BY PCR: NEGATIVE

## 2015-04-02 MED ORDER — MELOXICAM 15 MG PO TABS
15.0000 mg | ORAL_TABLET | Freq: Once | ORAL | Status: AC
  Administered 2015-04-02: 15 mg via ORAL
  Filled 2015-04-02: qty 1

## 2015-04-02 MED ORDER — SIMVASTATIN 40 MG PO TABS
40.0000 mg | ORAL_TABLET | Freq: Every day | ORAL | Status: DC
  Administered 2015-04-02 – 2015-04-05 (×4): 40 mg via ORAL
  Filled 2015-04-02 (×6): qty 1

## 2015-04-02 MED ORDER — HEPARIN SODIUM (PORCINE) 5000 UNIT/ML IJ SOLN
5000.0000 [IU] | Freq: Three times a day (TID) | INTRAMUSCULAR | Status: DC
  Administered 2015-04-02 – 2015-04-06 (×11): 5000 [IU] via SUBCUTANEOUS
  Filled 2015-04-02 (×14): qty 1

## 2015-04-02 MED ORDER — PANTOPRAZOLE SODIUM 40 MG PO TBEC
40.0000 mg | DELAYED_RELEASE_TABLET | Freq: Every day | ORAL | Status: DC
  Administered 2015-04-03 – 2015-04-06 (×4): 40 mg via ORAL
  Filled 2015-04-02 (×3): qty 1

## 2015-04-02 MED ORDER — DIVALPROEX SODIUM 125 MG PO DR TAB
125.0000 mg | DELAYED_RELEASE_TABLET | Freq: Two times a day (BID) | ORAL | Status: DC
  Administered 2015-04-02 – 2015-04-06 (×8): 125 mg via ORAL
  Filled 2015-04-02 (×10): qty 1

## 2015-04-02 MED ORDER — GABAPENTIN 100 MG PO CAPS
100.0000 mg | ORAL_CAPSULE | Freq: Two times a day (BID) | ORAL | Status: DC
  Administered 2015-04-02 – 2015-04-06 (×8): 100 mg via ORAL
  Filled 2015-04-02 (×10): qty 1

## 2015-04-02 MED ORDER — MELOXICAM 15 MG PO TABS
15.0000 mg | ORAL_TABLET | Freq: Every day | ORAL | Status: DC
  Administered 2015-04-03 – 2015-04-06 (×4): 15 mg via ORAL
  Filled 2015-04-02 (×4): qty 1

## 2015-04-02 MED ORDER — LORAZEPAM 0.5 MG PO TABS: 0.5000 mg | ORAL_TABLET | Freq: Two times a day (BID) | ORAL | Status: DC | PRN

## 2015-04-02 NOTE — ED Provider Notes (Signed)
[facility] Emergency Department Provider Note  Time seen: 3:35 PM  I have reviewed the triage vital signs and the nursing notes.   HISTORY  Chief Complaint Loss of Consciousness    HPI Phillip Robinson is a 81 y.o. male with a past medical history of hyperlipidemia, anxiety, hypertension, gastric reflux who presents the emergency department after a syncopal episode 2. According to the wife the patient has been complaining of some gas today. He was walking outside when he had a syncopal episode and fell to the ground. They helped him up to a chair and he had a second syncopal episode so they lied him down on the ground and called 911. Patient denies any chest pain at any time, or shortness of breath. The patient does not recall falling to the ground which is somewhat abnormal for the patient. According to the wife for the past 1-2 years the patient has been having frequent syncopal episodes which have been increasing in frequency recently. The patient saw Dr. Kirke Corin and is currently wearing a Holter monitor.Wife states they only came to the hospital because he passed out twice, otherwise they would not come. They are not sure if the patient hit his head. Currently patient denies any symptoms, feels normal.    Past Medical History  Diagnosis Date  . Hyperlipidemia   . Anxiety   . Hypertension   . GERD (gastroesophageal reflux disease)     Patient Active Problem List   Diagnosis Date Noted  . Faintness 03/19/2015  . Essential hypertension 03/19/2015    Past Surgical History  Procedure Laterality Date  . Cholecystectomy      Current Outpatient Rx  Name  Route  Sig  Dispense  Refill  . amLODipine (NORVASC) 5 MG tablet   Oral   Take 5 mg by mouth daily.         . cetirizine (ZYRTEC) 10 MG tablet   Oral   Take 10 mg by mouth daily.         . chlorhexidine (PERIDEX) 0.12 % solution   Mouth/Throat   Use as directed 15 mLs in the mouth or throat 2  (two) times daily.   120 mL   0   . divalproex (DEPAKOTE) 125 MG DR tablet   Oral   Take 125 mg by mouth 2 (two) times daily.         Marland Kitchen esomeprazole (NEXIUM) 20 MG capsule   Oral   Take 20 mg by mouth daily.          Marland Kitchen gabapentin (NEURONTIN) 100 MG capsule   Oral   Take 100 mg by mouth 2 (two) times daily.         Marland Kitchen LORazepam (ATIVAN) 0.5 MG tablet   Oral   Take 0.5 mg by mouth every 6 (six) hours as needed for anxiety.         . meloxicam (MOBIC) 15 MG tablet   Oral   Take 15 mg by mouth daily.         . simvastatin (ZOCOR) 40 MG tablet   Oral   Take 40 mg by mouth daily.           Allergies Sulfa antibiotics  Family History  Problem Relation Age of Onset  . Family history unknown: Yes    Social History History  Substance Use Topics  . Smoking status: Former Games developer  . Smokeless tobacco: Not on file  . Alcohol Use: No  Review of Systems Constitutional: Negative for fever. Positive for syncope Cardiovascular: Negative for chest pain. Respiratory: Negative for shortness of breath. Gastrointestinal: Negative for abdominal pain, vomiting and diarrhea. Neurological: Negative for headaches, focal weakness or numbness. 10-point ROS otherwise negative.  ____________________________________________   PHYSICAL EXAM:  VITAL SIGNS: ED Triage Vitals  Enc Vitals Group     BP 04/02/15 1525 149/74 mmHg     Pulse Rate 04/02/15 1525 81     Resp 04/02/15 1525 18     Temp 04/02/15 1525 97.6 F (36.4 C)     Temp Source 04/02/15 1525 Oral     SpO2 04/02/15 1525 98 %     Weight --      Height 04/02/15 1525  (1.803 m)     Head Cir --      Peak Flow --      Pain Score 04/02/15 1525 0     Pain Loc --      Pain Edu? --      Excl. in GC? --     Constitutional: Alert and oriented. Well appearing and in no distress. ENT   Head: Normocephalic and atraumatic   Mouth/Throat: Mucous membranes are moist. Cardiovascular: Normal rate, regular  rhythm. Respiratory: Normal respiratory effort without tachypnea nor retractions. Breath sounds are clear  Gastrointestinal: Soft and nontender. No distention.  There is no CVA tenderness. Musculoskeletal: Nontender with normal range of motion in all extremities. No lower extremity tenderness or edema. Neurologic:  Normal speech and language. No gross focal neurologic deficits are appreciated. Speech is normal. Skin:  Skin is warm, dry and intact.  Psychiatric: Mood and affect are normal. Speech and behavior are normal.  ____________________________________________    EKG  EKG reviewed and interpreted by myself shows normal sinus rhythm at 78 bpm, narrow QRS, left axis deviation, largely normal intervals, nonspecific ST changes are present, along with a right bundle branch block.  ____________________________________________    RADIOLOGY  CT head within normal limits.  ____________________________________________   INITIAL IMPRESSION / ASSESSMENT AND PLAN / ED COURSE  Pertinent labs & imaging results that were available during my care of the patient were reviewed by me and considered in my medical decision making (see chart for details).  81 year old male presents with syncope. Patient has a history of the same many times within the past 1 year. Currently seeing a cardiologist for workup. There was last seen in the emergency department on 03-18-2015 for a similar event with a negative ER workup. As the patient fell with possible head injury we will proceed with a CT scan to further evaluate as the patient does not recall the event. We will check labs, and closely monitor in the emergency department. Once her workup is complete I will discuss the plan of care with the patient's cardiologist Dr. Kirke Corin.    Nurse practitioner from Dr.Arida's office is in the emergency department currently speaking with the patient. It appears that the patient had a sinus pause evident on his Holter monitor.  They've discussed with [facility] and will arrange transfer to [facility] for consideration of a pacemaker for the patient. Labs are largely within normal limits, with mild white blood cell count elevation and negative troponin.  ____________________________________________   FINAL CLINICAL IMPRESSION(S) / ED DIAGNOSES  Syncope Sinus pause   Minna Antis, MD 04/02/15 1715

## 2015-04-02 NOTE — ED Notes (Signed)
Pt arrived via EMS from home with 2 syncopal episodes. Pt is alert and oriented on arrival. Denies pain , dizziness. Blood sugar 111. Pt is seeing cardiology for this problem and has a hlter monitor.

## 2015-04-02 NOTE — Consult Note (Signed)
CARDIOLOGY CONSULT NOTE   Patient ID: Phillip Robinson MRN: [id number], DOB/AGE: [birth year]   Admit date: 04/02/2015 Date of Consult: 04/02/2015   Primary Physician: Rafael Bihari, MD Primary Cardiologist: Judie Petit. Kirke Corin, MD   Pt. Profile  81 y/o male with a h/o syncope that presented to ED following syncope today.  Problem List  Past Medical History  Diagnosis Date  . Hyperlipidemia   . Anxiety   . Hypertension   . GERD (gastroesophageal reflux disease)   . Syncope     a. 03/2015 echo: EF 55-60%, no rwma, Gr 1 DD, mild MR;  b. 03/2015 Event monitor w/ documented sinus arrest.    Past Surgical History  Procedure Laterality Date  . Cholecystectomy       Allergies  Allergies  Allergen Reactions  . Sulfa Antibiotics     Other reaction(s): Unknown    HPI   81 y/o male with the above problem list. He was recently seen in clinic by Dr. Kirke Corin secondary to recurrent syncope described as sudden loss of consciousness.  Following one occasion, he did suffer a lip laceration and neck contusion following a fall associated with his syncopal spell and was noted to have bifasicular block on his ECG.  Pt was placed on an event monitor following his visit with Dr. Kirke Corin.  Today, he walked into his house from outside and told his wife that his stomach felt upset.  He walked to get medicine and then had sudden syncope witnessed by his wife and son.  They assisted him up and he regained consciousness w/in about 30 seconds but then lost consciousness again once they had placed him in a recliner.  He remained unresponsive for about 2 mins per wife's report and was gurgling.  EMS was called.  By the time they arrived, he had already regained consciousness and dry heaving.  He was also diaphoretic.  He was taken to the ED.  Pt is very HOH and thus interview is carried out with his wife.  Wife says that pt has been confused since the event, frequently asking what had happened.  Here, he is in sinus rhythm  w/o evidence of heart block though he does have a chronic bifasicular block.  He currently has no complaints.  Home Medications  Prior to Admission medications   Medication Sig Start Date End Date Taking? Authorizing Provider  amLODipine (NORVASC) 5 MG tablet Take 5 mg by mouth daily.    Historical Provider, MD  cetirizine (ZYRTEC) 10 MG tablet Take 10 mg by mouth daily.    Historical Provider, MD  chlorhexidine (PERIDEX) 0.12 % solution Use as directed 15 mLs in the mouth or throat 2 (two) times daily. 03/08/15   Sharman Cheek, MD  divalproex (DEPAKOTE) 125 MG DR tablet Take 125 mg by mouth 2 (two) times daily.    Historical Provider, MD  esomeprazole (NEXIUM) 20 MG capsule Take 20 mg by mouth daily.     Historical Provider, MD  gabapentin (NEURONTIN) 100 MG capsule Take 100 mg by mouth 2 (two) times daily.    Historical Provider, MD  LORazepam (ATIVAN) 0.5 MG tablet Take 0.5 mg by mouth every 6 (six) hours as needed for anxiety.    Historical Provider, MD  meloxicam (MOBIC) 15 MG tablet Take 15 mg by mouth daily.    Historical Provider, MD  simvastatin (ZOCOR) 40 MG tablet Take 40 mg by mouth daily.    Historical Provider, MD     Family History  Family History  Problem Relation Age of Onset  . CAD Brother   . Heart failure Brother   . Heart attack Father     died @ 20.     Social History History   Social History  . Marital Status: Married    Spouse Name: N/A  . Number of Children: N/A  . Years of Education: N/A   Occupational History  . Not on file.   Social History Main Topics  . Smoking status: Former Games developer  . Smokeless tobacco: Not on file  . Alcohol Use: No  . Drug Use: No  . Sexual Activity: Not on file   Other Topics Concern  . Not on file   Social History Narrative   Lives in [place] with wife.  Very sedentary.  Very HOH/deaf.     Review of Systems  Pt very HOH and unable to contribute to interview.  Wife says that other than syncope, he has not had any  recent complaints.  Physical Exam  Blood pressure 149/74, pulse 81, temperature 97.6 F (36.4 C), temperature source Oral, resp. rate 18, height 5\' 11"  (1.803 m), weight 152 lb (68.947 kg), SpO2 98 %.  General: Pleasant, NAD Psych: Normal affect. Neuro: Alert and oriented X 3. Moves all extremities spontaneously. HEENT: Normal  Neck: Supple without bruits or JVD. Lungs:  Resp regular and unlabored, CTA. Heart: RRR no s3, s4, or murmurs. Abdomen: Soft, non-tender, non-distended, BS + x 4.  Extremities: No clubbing, cyanosis or edema. DP/PT/Radials 2+ and equal bilaterally.  Labs   Recent Labs  04/02/15 1541  TROPONINI 0.03   Lab Results  Component Value Date   WBC 13.1* 04/02/2015   HGB 12.6* 04/02/2015   HCT 39.2* 04/02/2015   MCV 86.8 04/02/2015   PLT 266 04/02/2015     Recent Labs Lab 04/02/15 1541  NA 136  K 3.8  CL 100*  CO2 27  BUN 18  CREATININE 1.50*  CALCIUM 9.1  GLUCOSE 114*   Radiology/Studies  Head CT 04/02/2015  IMPRESSION: 1. No acute intracranial pathology. 2. Chronic microvascular disease and cerebral atrophy. _____________   ECG  RSR, 29m, RBBB, LAFB - no acute st/t changes.  ASSESSMENT AND PLAN  1.  Syncope/Sinus Arrest:  Pt has a h/o syncope and was recently placed on an event monitor.  He had recurrent syncope today with evidence of sinus arrest and asystole on monitor.  Currently hemodynamically stable in ED w/o evidence of heart block or bradycardia.  ECG notable for baseline bifasicular block.  We will arrange for tx to [facility] for CCU care as he will require EP evaluation and PPM placement on Monday.  Zoll pads should be left on in case he has recurrent asystole.  He is not on any AVN blocking agents.  2.  HTN:  Stable.    Signed, Nicolasa Ducking, NP 04/02/2015, 4:52 PM

## 2015-04-02 NOTE — ED Notes (Signed)
Family at bedside. 

## 2015-04-02 NOTE — ED Notes (Signed)
[facility] at bedside for transfer.  

## 2015-04-02 NOTE — ED Notes (Signed)
Cardiology consult in room with patient discussing need to transfer.

## 2015-04-03 DIAGNOSIS — R55 Syncope and collapse: Secondary | ICD-10-CM

## 2015-04-03 LAB — BASIC METABOLIC PANEL
ANION GAP: 10 (ref 5–15)
BUN: 14 mg/dL (ref 6–20)
CHLORIDE: 100 mmol/L — AB (ref 101–111)
CO2: 28 mmol/L (ref 22–32)
Calcium: 8.9 mg/dL (ref 8.9–10.3)
Creatinine, Ser: 1.41 mg/dL — ABNORMAL HIGH (ref 0.61–1.24)
GFR calc Af Amer: 52 mL/min — ABNORMAL LOW (ref >60)
GFR, EST NON AFRICAN AMERICAN: 44 mL/min — AB (ref >60)
GLUCOSE: 106 mg/dL — AB (ref 65–99)
Potassium: 4 mmol/L (ref 3.5–5.1)
SODIUM: 138 mmol/L (ref 135–145)

## 2015-04-03 LAB — CBC
HCT: 38 % — ABNORMAL LOW (ref 39.0–52.0)
Hemoglobin: 12.5 g/dL — ABNORMAL LOW (ref 13.0–17.0)
MCH: 28.3 pg (ref 26.0–34.0)
MCHC: 32.9 g/dL (ref 30.0–36.0)
MCV: 86.2 fL (ref 78.0–100.0)
PLATELETS: 256 10*3/uL (ref 150–400)
RBC: 4.41 MIL/uL (ref 4.22–5.81)
RDW: 14.8 % (ref 11.5–15.5)
WBC: 12.2 10*3/uL — ABNORMAL HIGH (ref 4.0–10.5)

## 2015-04-03 MED ORDER — YOU HAVE A PACEMAKER BOOK
Freq: Once | Status: DC
  Filled 2015-04-03: qty 1

## 2015-04-03 MED ORDER — SODIUM CHLORIDE 0.9 % IJ SOLN
3.0000 mL | Freq: Two times a day (BID) | INTRAMUSCULAR | Status: DC
  Administered 2015-04-03 – 2015-04-06 (×7): 3 mL via INTRAVENOUS

## 2015-04-03 NOTE — Consult Note (Signed)
Reason for Consult:syncope associated with prolonged bradycardia  Referring Physician: Dr. Belia Robinson is an 81 y.o. male.   HPI: The patient is an 81 yo man with a h/o HTN, dyslipidemia, who began to experience syncope over a year ago. His episodes have worsened over the past month. He had a cardiac monitor ordered by Dr. Kennith Robinson and was found to have recurrent syncope with documented prolonged asystole. He is transferred here for additional evaluation and treatment. He has preserved LV function by echo. No anginal symptoms. He has difficulty hearing.  PMH: Past Medical History  Diagnosis Date  . Hyperlipidemia   . Anxiety   . Hypertension   . GERD (gastroesophageal reflux disease)   . Syncope     a. 03/2015 echo: EF 55-60%, no rwma, Gr 1 DD, mild MR;  b. 03/2015 Event monitor w/ documented sinus arrest.    PSHX: Past Surgical History  Procedure Laterality Date  . Cholecystectomy      FAMHX: Family History  Problem Relation Age of Onset  . CAD Brother   . Heart failure Brother   . Heart attack Father     died @ 62.    Social History:  reports that he has quit smoking. He does not have any smokeless tobacco history on file. He reports that he does not drink alcohol or use illicit drugs.  Allergies:  Allergies  Allergen Reactions  . Sulfa Antibiotics Other (See Comments)    Reaction:  Unknown     Medications: I have reviewed the patient's current medications.  Ct Head Wo Contrast  04/02/2015   CLINICAL DATA:  Status post fall.  Loss of consciousness.  EXAM: CT HEAD WITHOUT CONTRAST  TECHNIQUE: Contiguous axial images were obtained from the base of the skull through the vertex without intravenous contrast.  COMPARISON:  03/08/2015  FINDINGS: There is no evidence of mass effect, midline shift, or extra-axial fluid collections. There is no evidence of a space-occupying lesion or intracranial hemorrhage. There is no evidence of a cortical-based area of acute  infarction. There are old bilateral basal ganglia lacunar infarct. There is generalized cerebral atrophy. There is periventricular white matter low attenuation likely secondary to microangiopathy.  The ventricles and sulci are appropriate for the patient's age. The basal cisterns are patent.  Visualized portions of the orbits are unremarkable. There is a small air-fluid level in the left sphenoid sinus. The mastoid sinuses are clear. Cerebrovascular atherosclerotic calcifications are noted.  The osseous structures are unremarkable.  IMPRESSION: 1.   1.  No acute intracranial pathology. 2. Chronic microvascular disease and cerebral atrophy. 2.   Electronically Signed   By: Phillip Robinson   On: 04/02/2015 16:24    ROS  As stated in the HPI and negative for all other systems.  Physical Exam  Vitals:Blood pressure 86/44, pulse 69, temperature 97.8 F (36.6 C), temperature source Oral, resp. rate 12, height  (1.702 m), weight 148 lb 2.4 oz (67.2 kg), SpO2 96 %.  Well appearing elderly man, hard of hearing but in NAD HEENT: Unremarkable Neck:  No JVD, no thyromegally Lymphatics:  No adenopathy Back:  No CVA tenderness Lungs:  Clear with no wheezes, rales, or rhonchi HEART:  Regular rate rhythm, no murmurs, no rubs, no clicks Abd:  Flat, positive bowel sounds, no organomegally, no rebound, no guarding Ext:  2 plus pulses, no edema, no cyanosis, no clubbing Skin:  No rashes no nodules Neuro:  CN II through XII intact, motor  grossly intact  ECG - NSR with RBBB, LAFB  Assessment/Plan: 1. [place] syncope 2. Bifascicular block at baseline Rec: will plan to proceed with PPM on Monday. If he has recurrent pauses while in the hospital over the weekend will place PPM urgently with cath lab/ep lab staff. He will remain at bed rest. He is on amlodipine but I do not think this would account for his current problem in the setting of underlying conduction system disease.  Phillip Gowda TaylorMD 04/03/2015,  9:07 AM

## 2015-04-03 NOTE — Progress Notes (Signed)
Subjective:  No CP/SOB, to arrythmias  Objective:  Temp:  [97.6 F (36.4 C)-98.2 F (36.8 C)] 97.8 F (36.6 C) (06/11 0730) Pulse Rate:  [61-127] 69 (06/11 0700) Resp:  [12-25] 12 (06/11 0700) BP: (86-149)/(42-86) 86/44 mmHg (06/11 0700) SpO2:  [90 %-99 %] 96 % (06/11 0700) Weight:  [148 lb 2.4 oz (67.2 kg)-152 lb (68.947 kg)] 148 lb 2.4 oz (67.2 kg) (06/10 1900) Weight change:   Intake/Output from previous day: 06/10 0701 - 06/11 0700 In: -  Out: 500 [Urine:500]  Intake/Output from this shift:    Physical Exam: General appearance: alert, no distress and HOH Neck: no adenopathy, no carotid bruit, no JVD, supple, symmetrical, trachea midline and thyroid not enlarged, symmetric, no tenderness/mass/nodules Lungs: clear to auscultation bilaterally Heart: regular rate and rhythm, S1, S2 normal, no murmur, click, rub or gallop Extremities: extremities normal, atraumatic, no cyanosis or edema  Lab Results: Results for orders placed or performed during the hospital encounter of 04/02/15 (from the past 48 hour(s))  MRSA PCR Screening     Status: None   Collection Time: 04/02/15  7:37 PM  Result Value Ref Range   MRSA by PCR NEGATIVE NEGATIVE    Comment:        The GeneXpert MRSA Assay (FDA approved for NASAL specimens only), is one component of a comprehensive MRSA colonization surveillance program. It is not intended to diagnose MRSA infection nor to guide or monitor treatment for MRSA infections.   CBC     Status: Abnormal   Collection Time: 04/02/15  9:51 PM  Result Value Ref Range   WBC 15.5 (H) 4.0 - 10.5 K/uL   RBC 4.45 4.22 - 5.81 MIL/uL   Hemoglobin 12.7 (L) 13.0 - 17.0 g/dL   HCT 38.3 (L) 39.0 - 52.0 %   MCV 86.1 78.0 - 100.0 fL   MCH 28.5 26.0 - 34.0 pg   MCHC 33.2 30.0 - 36.0 g/dL   RDW 14.9 11.5 - 15.5 %   Platelets 248 150 - 400 K/uL  Creatinine, serum     Status: Abnormal   Collection Time: 04/02/15  9:51 PM  Result Value Ref Range   Creatinine, Ser 1.39 (H) 0.61 - 1.24 mg/dL   GFR calc non Af Amer 45 (L) >60 mL/min   GFR calc Af Amer 52 (L) >60 mL/min    Comment: (NOTE) The eGFR has been calculated using the CKD EPI equation. This calculation has not been validated in all clinical situations. eGFR's persistently <60 mL/min signify possible Chronic Kidney Disease.   Basic metabolic panel     Status: Abnormal   Collection Time: 04/03/15  2:45 AM  Result Value Ref Range   Sodium 138 135 - 145 mmol/L   Potassium 4.0 3.5 - 5.1 mmol/L   Chloride 100 (L) 101 - 111 mmol/L   CO2 28 22 - 32 mmol/L   Glucose, Bld 106 (H) 65 - 99 mg/dL   BUN 14 6 - 20 mg/dL   Creatinine, Ser 1.41 (H) 0.61 - 1.24 mg/dL   Calcium 8.9 8.9 - 10.3 mg/dL   GFR calc non Af Amer 44 (L) >60 mL/min   GFR calc Af Amer 52 (L) >60 mL/min    Comment: (NOTE) The eGFR has been calculated using the CKD EPI equation. This calculation has not been validated in all clinical situations. eGFR's persistently <60 mL/min signify possible Chronic Kidney Disease.    Anion gap 10 5 - 15  CBC  Status: Abnormal   Collection Time: 04/03/15  2:45 AM  Result Value Ref Range   WBC 12.2 (H) 4.0 - 10.5 K/uL   RBC 4.41 4.22 - 5.81 MIL/uL   Hemoglobin 12.5 (L) 13.0 - 17.0 g/dL   HCT 38.0 (L) 39.0 - 52.0 %   MCV 86.2 78.0 - 100.0 fL   MCH 28.3 26.0 - 34.0 pg   MCHC 32.9 30.0 - 36.0 g/dL   RDW 14.8 11.5 - 15.5 %   Platelets 256 150 - 400 K/uL    Imaging: Imaging results have been reviewed  Assessment/Plan:   1. Active Problems: 2.   Syncope 3.   Time Spent Directly with Patient:  20 minutes  Length of Stay:  LOS: 1 day   Pt with syncopal episodes and documented SA on event monitoring. Nl LV fxn. On no neg chronotropic drugs. For PTVPM Monday. Will transfer to stepdown. Dr. Lovena Le to see today from EP.  Phillip Robinson 04/03/2015, 8:08 AM

## 2015-04-04 MED ORDER — SODIUM CHLORIDE 0.9 % IV SOLN
INTRAVENOUS | Status: DC
  Administered 2015-04-05: 06:00:00 via INTRAVENOUS

## 2015-04-04 MED ORDER — CEFAZOLIN SODIUM-DEXTROSE 2-3 GM-% IV SOLR
2.0000 g | INTRAVENOUS | Status: DC
  Filled 2015-04-04: qty 50

## 2015-04-04 MED ORDER — SODIUM CHLORIDE 0.9 % IR SOLN
80.0000 mg | Status: DC
  Filled 2015-04-04 (×2): qty 2

## 2015-04-04 NOTE — Progress Notes (Signed)
Subjective:  No CP/SOB. No further arrhythmias . For PTVPM tomorrow with Dr. Lovena Le  Objective:  Temp:  [97.6 F (36.4 C)-98 F (36.7 C)] 97.7 F (36.5 C) (06/12 0800) Resp:  [14] 14 (06/11 1800) BP: (97-155)/(51-72) 97/51 mmHg (06/12 0400) SpO2:  [95 %-100 %] 95 % (06/12 0800) Weight change:   Intake/Output from previous day: 06/11 0701 - 06/12 0700 In: 600 [P.O.:600] Out: 375 [Urine:375]  Intake/Output from this shift: Total I/O In: 240 [P.O.:240] Out: 200 [Urine:200]  Physical Exam: General appearance: alert and no distress Neck: no adenopathy, no carotid bruit, no JVD, supple, symmetrical, trachea midline and thyroid not enlarged, symmetric, no tenderness/mass/nodules Lungs: clear to auscultation bilaterally Heart: regular rate and rhythm, S1, S2 normal, no murmur, click, rub or gallop Extremities: extremities normal, atraumatic, no cyanosis or edema  Lab Results: Results for orders placed or performed during the hospital encounter of 04/02/15 (from the past 48 hour(s))  MRSA PCR Screening     Status: None   Collection Time: 04/02/15  7:37 PM  Result Value Ref Range   MRSA by PCR NEGATIVE NEGATIVE    Comment:        The GeneXpert MRSA Assay (FDA approved for NASAL specimens only), is one component of a comprehensive MRSA colonization surveillance program. It is not intended to diagnose MRSA infection nor to guide or monitor treatment for MRSA infections.   CBC     Status: Abnormal   Collection Time: 04/02/15  9:51 PM  Result Value Ref Range   WBC 15.5 (H) 4.0 - 10.5 K/uL   RBC 4.45 4.22 - 5.81 MIL/uL   Hemoglobin 12.7 (L) 13.0 - 17.0 g/dL   HCT 38.3 (L) 39.0 - 52.0 %   MCV 86.1 78.0 - 100.0 fL   MCH 28.5 26.0 - 34.0 pg   MCHC 33.2 30.0 - 36.0 g/dL   RDW 14.9 11.5 - 15.5 %   Platelets 248 150 - 400 K/uL  Creatinine, serum     Status: Abnormal   Collection Time: 04/02/15  9:51 PM  Result Value Ref Range   Creatinine, Ser 1.39 (H) 0.61 - 1.24  mg/dL   GFR calc non Af Amer 45 (L) >60 mL/min   GFR calc Af Amer 52 (L) >60 mL/min    Comment: (NOTE) The eGFR has been calculated using the CKD EPI equation. This calculation has not been validated in all clinical situations. eGFR's persistently <60 mL/min signify possible Chronic Kidney Disease.   Basic metabolic panel     Status: Abnormal   Collection Time: 04/03/15  2:45 AM  Result Value Ref Range   Sodium 138 135 - 145 mmol/L   Potassium 4.0 3.5 - 5.1 mmol/L   Chloride 100 (L) 101 - 111 mmol/L   CO2 28 22 - 32 mmol/L   Glucose, Bld 106 (H) 65 - 99 mg/dL   BUN 14 6 - 20 mg/dL   Creatinine, Ser 1.41 (H) 0.61 - 1.24 mg/dL   Calcium 8.9 8.9 - 10.3 mg/dL   GFR calc non Af Amer 44 (L) >60 mL/min   GFR calc Af Amer 52 (L) >60 mL/min    Comment: (NOTE) The eGFR has been calculated using the CKD EPI equation. This calculation has not been validated in all clinical situations. eGFR's persistently <60 mL/min signify possible Chronic Kidney Disease.    Anion gap 10 5 - 15  CBC     Status: Abnormal   Collection Time: 04/03/15  2:45 AM  Result Value Ref Range   WBC 12.2 (H) 4.0 - 10.5 K/uL   RBC 4.41 4.22 - 5.81 MIL/uL   Hemoglobin 12.5 (L) 13.0 - 17.0 g/dL   HCT 38.0 (L) 39.0 - 52.0 %   MCV 86.2 78.0 - 100.0 fL   MCH 28.3 26.0 - 34.0 pg   MCHC 32.9 30.0 - 36.0 g/dL   RDW 14.8 11.5 - 15.5 %   Platelets 256 150 - 400 K/uL    Imaging: Imaging results have been reviewed  Assessment/Plan:   1. Active Problems: 2.   Syncope 3.   Time Spent Directly with Patient:  15 minutes  Length of Stay:  LOS: 2 days   Asymptomatic. No arrhythmias or pauses overnight. Exam benign, Labs OK, For PTVPM tomorrow. Questions answered from Pt and family.   Phillip Robinson 04/04/2015, 11:03 AM

## 2015-04-05 ENCOUNTER — Ambulatory Visit: Payer: Medicare Other | Admitting: Cardiovascular Disease

## 2015-04-05 ENCOUNTER — Encounter (HOSPITAL_COMMUNITY): Payer: Self-pay | Admitting: General Practice

## 2015-04-05 ENCOUNTER — Encounter (HOSPITAL_COMMUNITY)
Admission: AD | Disposition: A | Discharge status: Home / Self Care | Payer: Medicare Other | Source: Other Acute Inpatient Hospital | Attending: Dermatopathology

## 2015-04-05 DIAGNOSIS — I442 Atrioventricular block, complete: Principal | ICD-10-CM

## 2015-04-05 DIAGNOSIS — Z950 Presence of cardiac pacemaker: Secondary | ICD-10-CM

## 2015-04-05 DIAGNOSIS — I459 Conduction disorder, unspecified: Secondary | ICD-10-CM

## 2015-04-05 HISTORY — PX: INSERT / REPLACE / REMOVE PACEMAKER: SUR710

## 2015-04-05 HISTORY — PX: EP IMPLANTABLE DEVICE: SHX172B

## 2015-04-05 HISTORY — DX: Presence of cardiac pacemaker: Z95.0

## 2015-04-05 SURGERY — PACEMAKER IMPLANT
Anesthesia: LOCAL

## 2015-04-05 MED ORDER — CEFAZOLIN SODIUM-DEXTROSE 2-3 GM-% IV SOLR
INTRAVENOUS | Status: DC | PRN
  Administered 2015-04-05: 2 g via INTRAVENOUS

## 2015-04-05 MED ORDER — ACETAMINOPHEN 325 MG PO TABS: 325.0000 mg | ORAL_TABLET | ORAL | Status: DC | PRN

## 2015-04-05 MED ORDER — SODIUM CHLORIDE 0.9 % IV SOLN: INTRAVENOUS | Status: AC

## 2015-04-05 MED ORDER — CEFAZOLIN SODIUM 1-5 GM-% IV SOLN
1.0000 g | Freq: Four times a day (QID) | INTRAVENOUS | Status: AC
  Administered 2015-04-05 – 2015-04-06 (×3): 1 g via INTRAVENOUS
  Filled 2015-04-05 (×4): qty 50

## 2015-04-05 MED ORDER — SODIUM CHLORIDE 0.9 % IR SOLN
Status: DC | PRN
  Administered 2015-04-05: 12:00:00

## 2015-04-05 MED ORDER — SODIUM CHLORIDE 0.9 % IR SOLN
Status: AC
  Filled 2015-04-05: qty 2

## 2015-04-05 MED ORDER — CEFAZOLIN SODIUM-DEXTROSE 2-3 GM-% IV SOLR
INTRAVENOUS | Status: AC
  Filled 2015-04-05: qty 50

## 2015-04-05 MED ORDER — LIDOCAINE HCL (PF) 1 % IJ SOLN
INTRAMUSCULAR | Status: DC | PRN
  Administered 2015-04-05: 40 mg via SUBCUTANEOUS

## 2015-04-05 MED ORDER — MIDAZOLAM HCL 5 MG/5ML IJ SOLN
INTRAMUSCULAR | Status: AC
  Filled 2015-04-05: qty 5

## 2015-04-05 MED ORDER — HEPARIN (PORCINE) IN NACL 2-0.9 UNIT/ML-% IJ SOLN
INTRAMUSCULAR | Status: AC
  Filled 2015-04-05: qty 500

## 2015-04-05 MED ORDER — FENTANYL CITRATE (PF) 100 MCG/2ML IJ SOLN
INTRAMUSCULAR | Status: AC
  Filled 2015-04-05: qty 2

## 2015-04-05 MED ORDER — ONDANSETRON HCL 4 MG/2ML IJ SOLN: 4.0000 mg | Freq: Four times a day (QID) | INTRAMUSCULAR | Status: DC | PRN

## 2015-04-05 MED ORDER — LIDOCAINE HCL (PF) 1 % IJ SOLN
INTRAMUSCULAR | Status: AC
  Filled 2015-04-05: qty 60

## 2015-04-05 MED ORDER — MIDAZOLAM HCL 5 MG/5ML IJ SOLN
INTRAMUSCULAR | Status: DC | PRN
  Administered 2015-04-05: 1 mg via INTRAVENOUS

## 2015-04-05 SURGICAL SUPPLY — 13 items
CABLE SURGICAL S-101-97-12 (CABLE) ×2 IMPLANT
HEMOSTAT SURGICEL 2X4 FIBR (HEMOSTASIS) IMPLANT
KIT ESSENTIALS PG (KITS) IMPLANT
LEAD CAPSURE NOVUS 5076-52CM (Lead) ×2 IMPLANT
LEAD CAPSURE NOVUS 5076-58CM (Lead) ×2 IMPLANT
PAD DEFIB LIFELINK (PAD) ×2 IMPLANT
PPM ADVISA MRI DR A2DR01 (Pacemaker) ×2 IMPLANT
SHEATH CLASSIC 7F (SHEATH) ×4 IMPLANT
SHEATH CLASSIC 8F (SHEATH) IMPLANT
SHEATH CLASSIC 9.5F (SHEATH) IMPLANT
SHEATH CLASSIC 9F (SHEATH) IMPLANT
SHIELD RADPAD SCOOP 12X17 (MISCELLANEOUS) IMPLANT
TRAY PACEMAKER INSERTION (CUSTOM PROCEDURE TRAY) ×2 IMPLANT

## 2015-04-05 NOTE — Progress Notes (Signed)
CCMD notified for telemetry box confirmation.  Mr. Phillip Robinson on [id number].

## 2015-04-05 NOTE — Progress Notes (Signed)
       Patient Name: Phillip Robinson      SUBJECTIVE: admitted with syncope and documented intermittent complete heart block int he setting of bifascicular block  Normal LV funciton 6/16  Past Medical History  Diagnosis Date  . Hyperlipidemia   . Anxiety   . Hypertension   . GERD (gastroesophageal reflux disease)   . Syncope     a. 03/2015 echo: EF 55-60%, no rwma, Gr 1 DD, mild MR;  b. 03/2015 Event monitor w/ documented sinus arrest.    Scheduled Meds:  Scheduled Meds: .  ceFAZolin (ANCEF) IV  2 g Intravenous To Cath  . divalproex  125 mg Oral BID  . gabapentin  100 mg Oral BID  . gentamicin irrigation  80 mg Irrigation To Cath  . heparin  5,000 Units Subcutaneous 3 times per day  . meloxicam  15 mg Oral Daily  . pantoprazole  40 mg Oral Daily  . simvastatin  40 mg Oral QHS  . sodium chloride  3 mL Intravenous Q12H  . you have a pacemaker book   Does not apply Once   Continuous Infusions: . sodium chloride 50 mL/hr at 04/05/15 0557   LORazepam    PHYSICAL EXAM Filed Vitals:   04/04/15 2338 04/05/15 0355 04/05/15 0400 04/05/15 0600  BP:   143/69   Pulse:      Temp: 97.8 F (36.6 C) 97.7 F (36.5 C)    TempSrc: Oral Oral    Resp:   17 15  Height:      Weight:    149 lb 4 oz (67.7 kg)  SpO2: 96% 95%     Well developed and nourished in no acute distress HENT normal Neck supple Clear Regular rate and rhythm, no murmurs or gallops Abd-soft with active BS No Clubbing cyanosis edema Skin-warm and dry A & Oriented  Grossly normal sensory and motor function   TELEMETRY: Reviewed telemetry pt in sinus with RBBB:    Intake/Output Summary (Last 24 hours) at 04/05/15 0753 Last data filed at 04/04/15 2342  Gross per 24 hour  Intake    240 ml  Output    500 ml  Net   -260 ml    LABS: Basic Metabolic Panel:  Recent Labs Lab 04/02/15 1541 04/02/15 2151 04/03/15 0245  NA 136  --  138  K 3.8  --  4.0  CL 100*  --  100*  CO2 27  --  28  GLUCOSE  114*  --  106*  BUN 18  --  14  CREATININE 1.50* 1.39* 1.41*  CALCIUM 9.1  --  8.9   Cardiac Enzymes:  Recent Labs  04/02/15 1541  TROPONINI 0.03   CBC:  Recent Labs Lab 04/02/15 1541 04/02/15 2151 04/03/15 0245  WBC 13.1* 15.5* 12.2*  HGB 12.6* 12.7* 12.5*  HCT 39.2* 38.3* 38.0*  MCV 86.8 86.1 86.2  PLT 266 248 256    *  ASSESSMENT AND PLAN:  Active Problems:   Syncope   Complete heart block  No intercurrent arrhythmia The benefits and risks were reviewed including but not limited to death,  perforation, infection, lead dislodgement and device malfunction.  The patient understands agrees and is willing to proceed.   Signed, Sherryl Manges MD  04/05/2015

## 2015-04-06 ENCOUNTER — Inpatient Hospital Stay (HOSPITAL_COMMUNITY): Payer: Medicare Other

## 2015-04-06 DIAGNOSIS — Z959 Presence of cardiac and vascular implant and graft, unspecified: Secondary | ICD-10-CM | POA: Insufficient documentation

## 2015-04-06 MED FILL — Heparin Sodium (Porcine) 2 Unit/ML in Sodium Chloride 0.9%: INTRAMUSCULAR | Qty: 500 | Status: AC

## 2015-04-06 NOTE — Progress Notes (Signed)
    Patient Profile: 82 y/o admitted with syncope and documented intermittent complete heart block in the setting of bifascicular block.   Subjective: No major complaints. Only mild incisional soreness. No chest pain or dyspnea.    Objective: Vital signs in last 24 hours: Temp:  [97.4 F (36.3 C)-98.3 F (36.8 C)] 98.1 F (36.7 C) (06/14 0501) Pulse Rate:  [0-84] 76 (06/14 0501) Resp:  [0-47] 18 (06/14 0501) BP: (124-164)/(63-91) 148/69 mmHg (06/14 0501) SpO2:  [0 %-100 %] 96 % (06/14 0501) Weight:  [145 lb 4.5 oz (65.9 kg)-149 lb 14.6 oz (68 kg)] 145 lb 4.5 oz (65.9 kg) (06/14 0501)    Intake/Output from previous day: 06/13 0701 - 06/14 0700 In: 660 [P.O.:660] Out: 800 [Urine:800] Intake/Output this shift:    Medications Current Facility-Administered Medications  Medication Dose Route Frequency Provider Last Rate Last Dose  . acetaminophen (TYLENOL) tablet 325-650 mg  325-650 mg Oral Q4H PRN Duke Salvia, MD      . divalproex (DEPAKOTE) DR tablet 125 mg  125 mg Oral BID Dwana Melena, PA-C   125 mg at 04/05/15 2140  . gabapentin (NEURONTIN) capsule 100 mg  100 mg Oral BID Dwana Melena, PA-C   100 mg at 04/05/15 2140  . heparin injection 5,000 Units  5,000 Units Subcutaneous 3 times per day Dwana Melena, PA-C   5,000 Units at 04/06/15 0504  . LORazepam (ATIVAN) tablet 0.5 mg  0.5 mg Oral BID PRN Dwana Melena, PA-C      . meloxicam [facility]) tablet 15 mg  15 mg Oral Daily Dwana Melena, PA-C   15 mg at 04/05/15 0939  . ondansetron (ZOFRAN) injection 4 mg  4 mg Intravenous Q6H PRN Duke Salvia, MD      . pantoprazole (PROTONIX) EC tablet 40 mg  40 mg Oral Daily Dwana Melena, PA-C   40 mg at 04/05/15 9458  . simvastatin (ZOCOR) tablet 40 mg  40 mg Oral QHS Dwana Melena, PA-C   40 mg at 04/05/15 2140  . sodium chloride 0.9 % injection 3 mL  3 mL Intravenous Q12H Iran Ouch, MD   3 mL at 04/05/15 2140  . you have a pacemaker book   Does not apply Once Marinus Maw,  MD        PE: General appearance: alert, cooperative and no distress Neck: no carotid bruit and no JVD Lungs: clear to auscultation bilaterally Heart: regular rate and rhythm, S1, S2 normal, no murmur, click, rub or gallop Extremities: no LEE Pulses: 2+ and symmetric Skin: warm and dry Neurologic: Grossly normal   Studies/Results: CXR 04/06/15 - pending   Assessment/Plan  Active Problems:   Syncope   Complete heart block   1. CHB: day 1 s/p PPM. Wound stable. CXR pending. Device interrogation reveals normal functioning. No chest pain or dyspnea. Plan for likely discharge home today if CXR is normal w/o evidence for pneumothorax. Will also get a Pt/OT consult prior to discharge. 7-10 day wound check will be arranged. MD to follow with further recommendations.    LOS: 4 days    Gabriele Zwilling M. Delmer Islam 04/06/2015 7:49 AM

## 2015-04-06 NOTE — Discharge Instructions (Signed)
° ° °  Supplemental Discharge Instructions for  Pacemaker/Defibrillator Patients  Activity No heavy lifting or vigorous activity with your left/right arm for 6 to 8 weeks.  Do not raise your left/right arm above your head for one week.  Gradually raise your affected arm as drawn below.          04/07/15                      04/09/15                     04/11/15                    04/13/15 __  NO DRIVING for   7 days  ; you may begin driving on   05/31/97 .  WOUND CARE - Keep the wound area clean and dry.  Do not get this area wet for one week. No showers for one week; you may shower on  04/14/15. - The tape/steri-strips on your wound will fall off; do not pull them off.  No bandage is needed on the site.  DO  NOT apply any creams, oils, or ointments to the wound area. - If you notice any drainage or discharge from the wound, any swelling or bruising at the site, or you develop a fever > 101? F after you are discharged home, call the office at once.  Special Instructions - You are still able to use cellular telephones; use the ear opposite the side where you have your pacemaker/defibrillator.  Avoid carrying your cellular phone near your device. - When traveling through airports, show security personnel your identification card to avoid being screened in the metal detectors.  Ask the security personnel to use the hand wand. - Avoid arc welding equipment, MRI testing (magnetic resonance imaging), TENS units (transcutaneous nerve stimulators).  Call the office for questions about other devices. - Avoid electrical appliances that are in poor condition or are not properly grounded. - Microwave ovens are safe to be near or to operate.  Additional information for defibrillator patients should your device go off: - If your device goes off ONCE and you feel fine afterward, notify the device clinic nurses. - If your device goes off ONCE and you do not feel well afterward, call 911. - If your device goes  off TWICE, call 911. - If your device goes off THREE times in one day, call 911.  DO NOT DRIVE YOURSELF OR A FAMILY MEMBER WITH A DEFIBRILLATOR TO THE HOSPITAL--CALL 911.

## 2015-04-06 NOTE — Care Management Note (Signed)
Case Management Note  Patient Details  Name: Phillip Robinson MRN: [id number] Date of Birth: [birth year]  Subjective/Objective:   Admitted with Syncopy                 Action/Plan: Talked to patient with spouse present about HHC choices, patient requested [facility] for HHPT/ OT; orders faxed to the [facility] agency and they will contact the patient at his home for a start up date and time for his therapy. Patient also stated that he does not need a walker or a 3:1.  Expected Discharge Date:    04/06/2015              Expected Discharge Plan:  Home w Home Health Services  Discharge planning Services  CM Consult    Choice offered to:  Patient  HH Arranged:  PT, OT [facility] Agency:  [facility] Status of Service:  In process, will continue to follow  Medicare Important Message Given:  Yes Date Medicare IM Given:  04/06/15 Medicare IM give by:  Abelino Derrick RN  Cherrie Distance, RN<BSN,MHA [phone] 04/06/2015, 1:03 PM

## 2015-04-06 NOTE — Evaluation (Signed)
Physical Therapy Evaluation Patient Details Name: Phillip Robinson MRN: [id number] DOB: [birth year] Today's Date: 04/06/2015   History of Present Illness  The patient is an 81 yo man with a h/o HTN, dyslipidemia, who began to experience syncope over a year ago. His episodes have worsened over the past month. He had a cardiac monitor ordered by Dr. Kennith Maes and was found to have recurrent syncope with documented prolonged asystole. He is transferred here for additional evaluation and treatment. He has preserved LV function by echo. No anginal symptoms. He has difficulty hearing  Clinical Impression  Pt very pleasant and moving well with pt and family educated for LUE ROM restrictions which is difficult for pt to recall since he is LHD. Family educated for donning/doffing clothes, transfers, use of Rw, safety and mobility at home. Pt will benefit from acute therapy to maximize mobility, precautions and function to decrease burden of care and fall risk. Pt to have assist of wife and nephew 24hrs at home.     Follow Up Recommendations Home health PT    Equipment Recommendations  None recommended by PT    Recommendations for Other Services       Precautions / Restrictions Precautions Precautions: Fall;ICD/Pacemaker Required Braces or Orthoses: Sling Restrictions Weight Bearing Restrictions:  (post-PPM insertion restrictions to right arm)      Mobility  Bed Mobility Overal bed mobility: Needs Assistance Bed Mobility: Supine to Sit;Sit to Supine     Supine to sit: Min assist Sit to supine: Min assist   General bed mobility comments: cues for sequence to not roll onto LUE or push with it. Left is his normal side of the bed but home bed will elevate and educated for mobility  Transfers Overall transfer level: Needs assistance   Transfers: Sit to/from Stand Sit to Stand: Min assist         General transfer comment: cues to stand from chair with armrest and min assist to stand from bed  without  Ambulation/Gait Ambulation/Gait assistance: Min guard Ambulation Distance (Feet): 300 Feet Assistive device: Rolling walker (2 wheeled) Gait Pattern/deviations: Step-through pattern;Decreased stride length   Gait velocity interpretation: Below normal speed for age/gender General Gait Details: pt with unsteady gait improved with  use of RW and educated for LUE positioning and safety with RW  Stairs Stairs: Yes Stairs assistance: Min guard Stair Management: One rail Right;Step to pattern;Forwards Number of Stairs: 1 General stair comments: cues for safety and keeping LUE at his side  Wheelchair Mobility    Modified Rankin (Stroke Patients Only)       Balance Overall balance assessment: Needs assistance   Sitting balance-Leahy Scale: Good       Standing balance-Leahy Scale: Fair                               Pertinent Vitals/Pain Pain Assessment: 0-10 Pain Score: 3  Pain Location: left pacemaker insertion site Pain Descriptors / Indicators: Aching Pain Intervention(s): Repositioned    Home Living Family/patient expects to be discharged to:: Private residence Living Arrangements: Spouse/significant other;Other relatives Available Help at Discharge: Family;Available 24 hours/day Type of Home: House Home Access: Stairs to enter   [employer] of Steps: 1 Home Layout: One level Home Equipment: Walker - 2 wheels;Cane - single point;Bedside commode;Shower seat;Wheelchair - manual      Prior Function Level of Independence: Independent         Comments: pt normally independent with  ADLs and gait states he has been using RW for the last week and has had 4 periods of syncope over the last year     Hand Dominance        Extremity/Trunk Assessment   Upper Extremity Assessment: Generalized weakness           Lower Extremity Assessment: Generalized weakness      Cervical / Trunk Assessment: Normal  Communication    Communication: HOH  Cognition Arousal/Alertness: Awake/alert Behavior During Therapy: WFL for tasks assessed/performed Overall Cognitive Status: Within Functional Limits for tasks assessed       Memory: Decreased recall of precautions              General Comments      Exercises        Assessment/Plan    PT Assessment Patient needs continued PT services  PT Diagnosis Abnormality of gait;Generalized weakness   PT Problem List Decreased strength;Decreased activity tolerance;Decreased range of motion;Decreased knowledge of use of DME;Decreased balance;Decreased knowledge of precautions;Decreased mobility  PT Treatment Interventions Gait training;DME instruction;Functional mobility training;Therapeutic activities;Therapeutic exercise;Patient/family education   PT Goals (Current goals can be found in the Care Plan section) Acute Rehab PT Goals Patient Stated Goal: return home PT Goal Formulation: With patient/family Time For Goal Achievement: 04/20/15 Potential to Achieve Goals: Good    Frequency Min 3X/week   Barriers to discharge        Co-evaluation               End of Session Equipment Utilized During Treatment: Other (comment) (sling) Activity Tolerance: Patient tolerated treatment well Patient left: in chair;with call bell/phone within reach;with family/visitor present Nurse Communication: Mobility status;Precautions         Time: 4967-5916 PT Time Calculation (min) (ACUTE ONLY): 18 min   Charges:   PT Evaluation $Initial PT Evaluation Tier I: 1 Procedure     PT G CodesDelorse Lek 04/06/2015, 10:51 AM Delaney Meigs, PT [phone]

## 2015-04-06 NOTE — Discharge Summary (Signed)
Physician Discharge Summary  Patient ID: Phillip Robinson MRN: [id number] DOB/AGE: [birth year] 81 y.o.   Primary Cardiologist: Dr. Kirke Corin Electrophysiologist: Dr. Graciela Husbands  Admit date: 04/02/2015 Discharge date: 04/06/2015  Admission Diagnoses: Syncope; CHB  Discharge Diagnoses:  Active Problems:   Syncope   Complete heart block   Discharged Condition: stable   Hospital Course: The patient is an 81 y/o man with a h/o HTN and dyslipidemia, who began to experience syncope over a year ago. His episodes have worsened over the past month. 2D echo 03/26/15 showed normal LVF. He had a cardiac monitor ordered by Dr. Kennith Maes and was found to have recurrent syncope with documented prolonged asystole/ bifascicular block. He was not on any AV nodal blocking agents. He was seen in the office on 04/02/15 and transferred to [facility] for additional evaluation and treatment.  He was admitted to the CCU and pacer pads were placed. He denied CP. No identifiable causes were identified.  He was evaluated by EP and it was recommended that he undergo PPM implantation.  On 04/05/15, he underwent successful Medtronic PPM implantation by Dr. Graciela Husbands. He tolerated the procedure well and had no post op complications. He denied chest pain and dyspnea. CXR showed proper lead placement and no evidence for pneumothorax. Device interrogation revealed normal functioning. He was evaluated by physical therapy prior to discharge, who recommended Home Health PT. He was last seen and examined by Dr. Graciela Husbands who determined he was stable for discharge home. Wound check follow-up has been arranged for 04/14/15. H/H PT also ordered.   Consults: None  Significant Diagnostic Studies:  Post Op CXR 04/06/15 FINDINGS: Dual lead pacemaker in place. No pneumothorax. Heart size and pulmonary vascularity are normal and the lungs are clear. No effusions. No acute osseous abnormality.  IMPRESSION: No acute abnormality. Pacemaker in place.  Treatments: See  Hospital Course  Discharge Exam: Blood pressure 148/69, pulse 76, temperature 98.1 F (36.7 C), temperature source Oral, resp. rate 18, height  (1.702 m), weight 145 lb 4.5 oz (65.9 kg), SpO2 96 %.   Disposition: 70-Another Health Care Institution Not Defined      Discharge Instructions    Diet - low sodium heart healthy    Complete by:  As directed      Face-to-face encounter (required for Medicare/Medicaid patients)    Complete by:  As directed   I SIMMONS, BRITTAINY certify that this patient is under my care and that I, or a nurse practitioner or physician's assistant working with me, had a face-to-face encounter that meets the physician face-to-face encounter requirements with this patient on 04/06/2015. The encounter with the patient was in whole, or in part for the following medical condition(s) which is the primary reason for home health care (List medical condition): bradycardia s/p pacemaker; weakness (functional limitations)  The encounter with the patient was in whole, or in part, for the following medical condition, which is the primary reason for home health care:  bradycardia s/p pacemaker  I certify that, based on my findings, the following services are medically necessary home health services:  Physical therapy  Reason for Medically Necessary Home Health Services:  Skilled Nursing- Change/Decline in Patient Status  My clinical findings support the need for the above services:  Unable to leave home safely without assistance and/or assistive device  Further, I certify that my clinical findings support that this patient is homebound due to:  Unable to leave home safely without assistance     Home Health  Complete by:  As directed   To provide the following care/treatments:   PT OT       Increase activity slowly    Complete by:  As directed             Medication List    TAKE these medications        amLODipine 5 MG tablet  Commonly known as:  NORVASC  Take 5  mg by mouth daily.     cetirizine 10 MG tablet  Commonly known as:  ZYRTEC  Take 10 mg by mouth daily as needed for allergies.     divalproex 125 MG DR tablet  Commonly known as:  DEPAKOTE  Take 125 mg by mouth 2 (two) times daily.     esomeprazole 20 MG capsule  Commonly known as:  NEXIUM  Take 20 mg by mouth daily.     gabapentin 100 MG capsule  Commonly known as:  NEURONTIN  Take 100 mg by mouth 2 (two) times daily.     LORazepam 0.5 MG tablet  Commonly known as:  ATIVAN  Take 0.5 mg by mouth 2 (two) times daily as needed for anxiety.     meloxicam 15 MG tablet  Commonly known as:  MOBIC  Take 15 mg by mouth daily.     simvastatin 40 MG tablet  Commonly known as:  ZOCOR  Take 40 mg by mouth daily at 6 PM.       Follow-up Information    Follow up with [facility] On 04/14/2015.   Why:  10:30 am , For suture removal, For wound re-check   Contact information:   [address] [place] [address]       TIME SPENT ON DISCHARGE INCLUDING PHYSICIAN TIME: >30 MINUTES  Signed: Robbie Lis 04/06/2015, 12:58 PM  Home today   PT assessment appreciated  Will see in followup at [facility]

## 2015-04-08 ENCOUNTER — Telehealth: Payer: Self-pay | Admitting: *Deleted

## 2015-04-08 NOTE — Telephone Encounter (Signed)
S/w Arvin, physical therapist with [facility]. Arvin states he has evaluated patient and recommends short-term home care and needs a verbal order. Verbal order was given based on Dr. Clayborne Artist original order for PT at discharge.

## 2015-04-08 NOTE — Telephone Encounter (Signed)
Needs A verbal order for PT Please advise

## 2015-04-14 ENCOUNTER — Ambulatory Visit (INDEPENDENT_AMBULATORY_CARE_PROVIDER_SITE_OTHER): Payer: Medicare Other | Admitting: *Deleted

## 2015-04-14 ENCOUNTER — Encounter: Payer: Self-pay | Admitting: Internal Medicine

## 2015-04-14 DIAGNOSIS — I442 Atrioventricular block, complete: Secondary | ICD-10-CM

## 2015-04-14 LAB — CUP PACEART INCLINIC DEVICE CHECK
Battery Voltage: 3.16 V
Brady Statistic AP VP Percent: 2.5 %
Brady Statistic AP VS Percent: 1.55 %
Brady Statistic AS VP Percent: 0.05 %
Brady Statistic AS VS Percent: 95.9 %
Brady Statistic RV Percent Paced: 2.55 %
Lead Channel Impedance Value: 456 Ohm
Lead Channel Impedance Value: 513 Ohm
Lead Channel Impedance Value: 532 Ohm
Lead Channel Pacing Threshold Amplitude: 0.75 V
Lead Channel Pacing Threshold Pulse Width: 0.4 ms
Lead Channel Sensing Intrinsic Amplitude: 2.75 mV
Lead Channel Sensing Intrinsic Amplitude: 4.25 mV
Lead Channel Sensing Intrinsic Amplitude: 4.875 mV
Lead Channel Setting Pacing Amplitude: 3.5 V
Lead Channel Setting Pacing Amplitude: 3.5 V
Lead Channel Setting Sensing Sensitivity: 1.2 mV
MDC IDC MSMT LEADCHNL RA PACING THRESHOLD AMPLITUDE: 0.625 V
MDC IDC MSMT LEADCHNL RA SENSING INTR AMPL: 2.875 mV
MDC IDC MSMT LEADCHNL RV IMPEDANCE VALUE: 456 Ohm
MDC IDC MSMT LEADCHNL RV PACING THRESHOLD PULSEWIDTH: 0.4 ms
MDC IDC SESS DTM: 20160622124641
MDC IDC SET LEADCHNL RV PACING PULSEWIDTH: 0.4 ms
MDC IDC SET ZONE DETECTION INTERVAL: 400 ms
MDC IDC SET ZONE DETECTION INTERVAL: 400 ms
MDC IDC STAT BRADY RA PERCENT PACED: 4.05 %

## 2015-04-14 NOTE — Progress Notes (Signed)
Wound check appointment. No steri-strips, dermabond used. Wound without redness or edema. Incision edges approximated, wound well healed. Normal device function. Thresholds, sensing, and impedances consistent with implant measurements. Device programmed at 3.5V/auto capture programmed on for extra safety margin until 3 month visit. Histogram distribution appropriate for patient and level of activity. No mode switches or high ventricular rates noted. Patient educated about wound care, arm mobility, lifting restrictions. ROV w/ SK 07/13/15.  

## 2015-04-29 ENCOUNTER — Ambulatory Visit (INDEPENDENT_AMBULATORY_CARE_PROVIDER_SITE_OTHER): Payer: Medicare Other | Admitting: Cardiovascular Disease

## 2015-04-29 ENCOUNTER — Encounter: Payer: Self-pay | Admitting: Cardiovascular Disease

## 2015-04-29 VITALS — BP 120/68 | HR 80 | Ht 67.0 in | Wt 150.5 lb

## 2015-04-29 DIAGNOSIS — I442 Atrioventricular block, complete: Secondary | ICD-10-CM | POA: Diagnosis not present

## 2015-04-29 DIAGNOSIS — Z950 Presence of cardiac pacemaker: Secondary | ICD-10-CM | POA: Diagnosis not present

## 2015-04-29 DIAGNOSIS — Z959 Presence of cardiac and vascular implant and graft, unspecified: Secondary | ICD-10-CM | POA: Diagnosis not present

## 2015-04-29 DIAGNOSIS — I10 Essential (primary) hypertension: Secondary | ICD-10-CM

## 2015-04-29 NOTE — Assessment & Plan Note (Signed)
The patient is overall doing well with no recurrent episodes of syncope or presyncope. He is to follow-up with Dr. Graciela HusbandsKlein in September.

## 2015-04-29 NOTE — Progress Notes (Signed)
Primary care physician: Dr. Yates Decamp  HPI  This is a pleasant 81 year old male who is here today for a follow-up visit after recent pacemaker placement. He was seen recently for recurrent syncope highly suggestive of arrhythmic etiology. His ECG showed bifascicular block.  An outpatient telemetry showed recurrent episodes of prolonged asystole associated with syncope. The patient was sent to the emergency room and ultimately underwent a dual-chamber Medtronic pacemaker placement by Dr. Graciela Husbands last month. Echocardiogram showed normal LV systolic function with mild aortic regurgitation. He has been doing well since then with no recurrent syncope or dizziness. His memory continues to decline. He is hard of hearing.  Allergies  Allergen Reactions  . Sulfa Antibiotics Other (See Comments)    Reaction:  Unknown      Current Outpatient Prescriptions on File Prior to Visit  Medication Sig Dispense Refill  . amLODipine (NORVASC) 5 MG tablet Take 5 mg by mouth daily.    . cetirizine (ZYRTEC) 10 MG tablet Take 10 mg by mouth daily as needed for allergies.     Marland Kitchen divalproex (DEPAKOTE) 125 MG DR tablet Take 125 mg by mouth 2 (two) times daily.    Marland Kitchen esomeprazole (NEXIUM) 20 MG capsule Take 20 mg by mouth daily.     Marland Kitchen gabapentin (NEURONTIN) 100 MG capsule Take 100 mg by mouth 2 (two) times daily.    Marland Kitchen LORazepam (ATIVAN) 0.5 MG tablet Take 0.5 mg by mouth 2 (two) times daily as needed for anxiety.     . meloxicam (MOBIC) 15 MG tablet Take 15 mg by mouth daily.    . simvastatin (ZOCOR) 40 MG tablet Take 40 mg by mouth daily at 6 PM.      No current facility-administered medications on file prior to visit.     Past Medical History  Diagnosis Date  . Hyperlipidemia   . Anxiety   . Hypertension   . GERD (gastroesophageal reflux disease)   . Syncope     a. 03/2015 echo: EF 55-60%, no rwma, Gr 1 DD, mild MR;  b. 03/2015 Event monitor w/ documented sinus arrest.  . Dysrhythmia     heart block  .  Arthritis   . Presence of permanent cardiac pacemaker 04/05/2015  . HOH (hard of hearing)      Past Surgical History  Procedure Laterality Date  . Cholecystectomy    . Insert / replace / remove pacemaker  04/05/2015  . Ep implantable device N/A 04/05/2015    Procedure: Pacemaker Implant;  Surgeon: Duke Salvia, MD;  Location: [facility] INVASIVE CV LAB;  Service: Cardiovascular;  Laterality: N/A;     Family History  Problem Relation Age of Onset  . CAD Brother   . Heart failure Brother   . Heart attack Father     died @ 3.     History   Social History  . Marital Status: Married    Spouse Name: N/A  . Number of Children: N/A  . Years of Education: N/A   Occupational History  . Not on file.   Social History Main Topics  . Smoking status: Former Games developer  . Smokeless tobacco: Never Used     Comment: quit smoking in the 80's  . Alcohol Use: No  . Drug Use: No  . Sexual Activity: Not on file   Other Topics Concern  . Not on file   Social History Narrative   Lives in [place] with wife.  Very sedentary.  Very HOH/deaf.  ROS A 10 point review of system was performed. It is negative other than that mentioned in the history of present illness.   PHYSICAL EXAM   BP 120/68 mmHg  Pulse 80  Ht 5\' 7"  (1.702 m)  Wt 150 lb 8 oz (68.266 kg)  BMI 23.57 kg/m2 Constitutional: He is oriented to person, place, and time. He appears well-developed and well-nourished. No distress.  HENT: No nasal discharge.  Head: Normocephalic and atraumatic.  Eyes: Pupils are equal and round.  No discharge. Neck: Normal range of motion. Neck supple. No JVD present. No thyromegaly present.  Cardiovascular: Normal rate, regular rhythm, normal heart sounds. Exam reveals no gallop and no friction rub. There is a 1/6 holosystolic murmur at the apex and left sternal border. Pacemaker site is intact. Pulmonary/Chest: Effort normal and breath sounds normal. No stridor. No respiratory distress. He has no  wheezes. He has no rales. He exhibits no tenderness.  Abdominal: Soft. Bowel sounds are normal. He exhibits no distension. There is no tenderness. There is no rebound and no guarding.  Musculoskeletal: Normal range of motion. He exhibits no edema and no tenderness.  Neurological: He is alert and oriented to person, place, and time. Coordination normal.  Skin: Skin is warm and dry. No rash noted. He is not diaphoretic. No erythema. No pallor.  Psychiatric: He has a normal mood and affect. His behavior is normal. Judgment and thought content normal.       EKG:  normal sinus rhythm with right bundle branch block and left anterior fascicular block.   ASSESSMENT AND PLAN

## 2015-04-29 NOTE — Patient Instructions (Signed)
Medication Instructions: Continue same medications.   Labwork: None.   Procedures/Testing: None.   Follow-Up: 6 months with Dr. Kirke CorinArida. Keep follow up with Dr. Graciela HusbandsKlein in September.   Any Additional Special Instructions Will Be Listed Below (If Applicable).

## 2015-04-29 NOTE — Assessment & Plan Note (Addendum)
Blood pressure is controlled on current medications. I advised him to follow-up with Dr. Dan HumphreysWalker about the issue of memory loss and possible dementia.

## 2015-07-05 ENCOUNTER — Encounter: Payer: Self-pay | Admitting: Internal Medicine

## 2015-07-05 ENCOUNTER — Ambulatory Visit (INDEPENDENT_AMBULATORY_CARE_PROVIDER_SITE_OTHER): Payer: Medicare Other | Admitting: Internal Medicine

## 2015-07-05 VITALS — BP 122/74 | HR 72 | Ht 67.0 in | Wt 148.5 lb

## 2015-07-05 DIAGNOSIS — Z959 Presence of cardiac and vascular implant and graft, unspecified: Secondary | ICD-10-CM | POA: Diagnosis not present

## 2015-07-05 DIAGNOSIS — I442 Atrioventricular block, complete: Secondary | ICD-10-CM

## 2015-07-05 DIAGNOSIS — Z950 Presence of cardiac pacemaker: Secondary | ICD-10-CM

## 2015-07-05 DIAGNOSIS — I10 Essential (primary) hypertension: Secondary | ICD-10-CM | POA: Diagnosis not present

## 2015-07-05 LAB — CUP PACEART INCLINIC DEVICE CHECK
Brady Statistic AP VS Percent: 2.9 %
Brady Statistic AS VP Percent: 0.09 %
Brady Statistic AS VS Percent: 75.59 %
Brady Statistic RA Percent Paced: 24.32 %
Brady Statistic RV Percent Paced: 21.52 %
Date Time Interrogation Session: 20160912103616
Lead Channel Impedance Value: 399 Ohm
Lead Channel Impedance Value: 513 Ohm
Lead Channel Impedance Value: 532 Ohm
Lead Channel Pacing Threshold Amplitude: 0.625 V
Lead Channel Pacing Threshold Pulse Width: 0.4 ms
Lead Channel Sensing Intrinsic Amplitude: 2.25 mV
Lead Channel Sensing Intrinsic Amplitude: 3.375 mV
Lead Channel Sensing Intrinsic Amplitude: 6.125 mV
Lead Channel Setting Pacing Amplitude: 1.5 V
Lead Channel Setting Pacing Amplitude: 2 V
Lead Channel Setting Pacing Pulse Width: 0.4 ms
MDC IDC MSMT BATTERY REMAINING LONGEVITY: 140 mo
MDC IDC MSMT BATTERY VOLTAGE: 3.08 V
MDC IDC MSMT LEADCHNL RA PACING THRESHOLD AMPLITUDE: 0.5 V
MDC IDC MSMT LEADCHNL RA PACING THRESHOLD PULSEWIDTH: 0.4 ms
MDC IDC MSMT LEADCHNL RV IMPEDANCE VALUE: 456 Ohm
MDC IDC MSMT LEADCHNL RV SENSING INTR AMPL: 7 mV
MDC IDC SET LEADCHNL RV SENSING SENSITIVITY: 1.2 mV
MDC IDC SET ZONE DETECTION INTERVAL: 400 ms
MDC IDC STAT BRADY AP VP PERCENT: 21.43 %
Zone Setting Detection Interval: 400 ms

## 2015-07-05 NOTE — Progress Notes (Signed)
      Patient Care Team: Rafael Bihari, MD as PCP - General (Internal Medicine)   HPI  Phillip Robinson is a 81 y.o. male seen in follow-up for pacemaker implanted 6/16 for adam stokes attacks in the context of underlying bifascicular block   No recurrent syncope  The patient denies chest pain, shortness of breath, nocturnal dyspnea, orthopnea or peripheral edema.  There have been no palpitations, lightheadedness or syncope.    Echocardiogram 6/16 normal LV function  Records and Results Reviewed   Past Medical History  Diagnosis Date  . Hyperlipidemia   . Anxiety   . Hypertension   . GERD (gastroesophageal reflux disease)   . Syncope     a. 03/2015 echo: EF 55-60%, no rwma, Gr 1 DD, mild MR;  b. 03/2015 Event monitor w/ documented sinus arrest.  . Dysrhythmia     heart block  . Arthritis   . Presence of permanent cardiac pacemaker 04/05/2015  . HOH (hard of hearing)     Past Surgical History  Procedure Laterality Date  . Cholecystectomy    . Insert / replace / remove pacemaker  04/05/2015  . Ep implantable device N/A 04/05/2015    Procedure: Pacemaker Implant;  Surgeon: Duke Salvia, MD;  Location: [facility] INVASIVE CV LAB;  Service: Cardiovascular;  Laterality: N/A;    Current Outpatient Prescriptions  Medication Sig Dispense Refill  . amLODipine (NORVASC) 5 MG tablet Take 5 mg by mouth daily.    . cetirizine (ZYRTEC) 10 MG tablet Take 10 mg by mouth daily as needed for allergies.     Marland Kitchen divalproex (DEPAKOTE) 125 MG DR tablet Take 125 mg by mouth 2 (two) times daily.    Marland Kitchen esomeprazole (NEXIUM) 20 MG capsule Take 20 mg by mouth daily.     Marland Kitchen gabapentin (NEURONTIN) 100 MG capsule Take 100 mg by mouth 2 (two) times daily.    Marland Kitchen LORazepam (ATIVAN) 0.5 MG tablet Take 0.5 mg by mouth 2 (two) times daily as needed for anxiety.     . meloxicam (MOBIC) 15 MG tablet Take 15 mg by mouth daily.    . simvastatin (ZOCOR) 40 MG tablet Take 40 mg by mouth daily at 6 PM.      No current  facility-administered medications for this visit.    Allergies  Allergen Reactions  . Sulfa Antibiotics Other (See Comments)    Reaction:  Unknown       Review of Systems negative except from HPI and PMH  Physical Exam BP 122/74 mmHg  Pulse 72  Ht  (1.702 m)  Wt 148 lb 8 oz (67.359 kg)  BMI 23.25 kg/m2 Well developed and well nourished in no acute distress HENT normal E scleral and icterus clear Neck Supple JVP flat; carotids brisk and full Clear to ausculation Device pocket well healed; without hematoma or erythema.  There is no tethering Regular rate and rhythm, no murmurs gallops or rub Soft with active bowel sounds No clubbing cyanosis  Edema Alert and oriented, grossly normal motor and sensory function Skin Warm and Dry    Assessment and  Plan  Bifasciucular block  Syncope  Intermittent complete heart block  Pacemaker  ,The patient's device was interrogated and the information was fully reviewed.  The device was reprogrammed to maximize longevity

## 2015-07-05 NOTE — Patient Instructions (Signed)
Medication Instructions:  Stop Amlodipine. Labwork: None  Testing/Procedures: None  Follow-Up: 9 month f/u with Dr. Graciela Husbands  Any Other Special Instructions Will Be Listed Below (If Applicable).  Remote monitoring is used to monitor your Pacemaker of ICD from home. This monitoring reduces the number of office visits required to check your device to one time per year. It allows [place] to keep an eye on the functioning of your device to ensure it is working properly. You are scheduled for a device check from home on 12/12. You may send your transmission at any time that day. If you have a wireless device, the transmission will be sent automatically. After your physician reviews your transmission, you will receive a postcard with your next transmission date.

## 2015-07-13 ENCOUNTER — Encounter: Payer: Medicare Other | Admitting: Internal Medicine

## 2015-07-16 ENCOUNTER — Encounter: Payer: Self-pay | Admitting: Internal Medicine

## 2015-10-04 ENCOUNTER — Telehealth: Payer: Self-pay | Admitting: Cardiology

## 2015-10-04 ENCOUNTER — Encounter: Payer: Medicare Other | Admitting: *Deleted

## 2015-10-04 NOTE — Telephone Encounter (Signed)
Spoke with pt and reminded pt of remote transmission that is due today. Pt verbalized understanding.   

## 2015-10-05 ENCOUNTER — Encounter: Payer: Self-pay | Admitting: Cardiology

## 2015-10-06 ENCOUNTER — Telehealth: Payer: Self-pay | Admitting: Internal Medicine

## 2015-10-06 NOTE — Telephone Encounter (Signed)
Patient needs remote device check instructions.

## 2015-10-07 ENCOUNTER — Ambulatory Visit (INDEPENDENT_AMBULATORY_CARE_PROVIDER_SITE_OTHER): Payer: Medicare Other | Admitting: *Deleted

## 2015-10-07 DIAGNOSIS — I442 Atrioventricular block, complete: Secondary | ICD-10-CM

## 2015-10-07 NOTE — Progress Notes (Signed)
Remote pacemaker transmission.   

## 2015-10-07 NOTE — Telephone Encounter (Signed)
Instructed pt how to send remote transmission. Informed pt that transmission was received.

## 2015-10-11 LAB — CUP PACEART REMOTE DEVICE CHECK
Brady Statistic AP VS Percent: 3.85 %
Brady Statistic AS VP Percent: 0.06 %
Brady Statistic AS VS Percent: 76.16 %
Date Time Interrogation Session: 20161215171023
Implantable Lead Location: 753859
Implantable Lead Model: 5076
Lead Channel Impedance Value: 399 Ohm
Lead Channel Impedance Value: 551 Ohm
Lead Channel Pacing Threshold Amplitude: 0.625 V
Lead Channel Pacing Threshold Pulse Width: 0.4 ms
Lead Channel Sensing Intrinsic Amplitude: 6.375 mV
Lead Channel Sensing Intrinsic Amplitude: 6.375 mV
Lead Channel Setting Pacing Amplitude: 2 V
Lead Channel Setting Pacing Pulse Width: 0.4 ms
MDC IDC LEAD IMPLANT DT: 20160613
MDC IDC LEAD IMPLANT DT: 20160613
MDC IDC LEAD LOCATION: 753860
MDC IDC MSMT BATTERY REMAINING LONGEVITY: 131 mo
MDC IDC MSMT BATTERY VOLTAGE: 3.04 V
MDC IDC MSMT LEADCHNL RA PACING THRESHOLD AMPLITUDE: 0.75 V
MDC IDC MSMT LEADCHNL RA PACING THRESHOLD PULSEWIDTH: 0.4 ms
MDC IDC MSMT LEADCHNL RA SENSING INTR AMPL: 2 mV
MDC IDC MSMT LEADCHNL RA SENSING INTR AMPL: 2 mV
MDC IDC MSMT LEADCHNL RV IMPEDANCE VALUE: 456 Ohm
MDC IDC MSMT LEADCHNL RV IMPEDANCE VALUE: 532 Ohm
MDC IDC SET LEADCHNL RA PACING AMPLITUDE: 1.75 V
MDC IDC SET LEADCHNL RV SENSING SENSITIVITY: 1.2 mV
MDC IDC STAT BRADY AP VP PERCENT: 19.93 %
MDC IDC STAT BRADY RA PERCENT PACED: 23.78 %
MDC IDC STAT BRADY RV PERCENT PACED: 19.99 %

## 2015-10-14 ENCOUNTER — Encounter: Payer: Self-pay | Admitting: Cardiology

## 2015-11-01 ENCOUNTER — Ambulatory Visit: Payer: Medicare Other | Admitting: Cardiovascular Disease

## 2015-12-24 ENCOUNTER — Ambulatory Visit (INDEPENDENT_AMBULATORY_CARE_PROVIDER_SITE_OTHER): Payer: Medicare Other | Admitting: Cardiovascular Disease

## 2015-12-24 ENCOUNTER — Encounter: Payer: Self-pay | Admitting: Cardiovascular Disease

## 2015-12-24 VITALS — BP 110/62 | HR 69 | Ht 67.0 in | Wt 148.5 lb

## 2015-12-24 DIAGNOSIS — I442 Atrioventricular block, complete: Secondary | ICD-10-CM | POA: Diagnosis not present

## 2015-12-24 DIAGNOSIS — Z950 Presence of cardiac pacemaker: Secondary | ICD-10-CM

## 2015-12-24 DIAGNOSIS — I10 Essential (primary) hypertension: Secondary | ICD-10-CM

## 2015-12-24 NOTE — Progress Notes (Signed)
Cardiology Office Note   Date:  12/24/2015   ID:  Phillip Robinson, DOB 08/02/1929, MRN [id number]  PCP:  Phillip Robinson, Phillip B, MD  Cardiologist:   Phillip BearsMuhammad Denim Start, MD   Chief Complaint  Patient presents with  . other    6 month follow up. Meds reviewed by the patient verbally. "doing well."       History of Present Illness: Phillip SimpersGurney L Creasey is a 82 y.o. male who presents for a follow-up visit regarding sinus node dysfunction status post pacemaker placement.  He had recurrent syncope last year with underlying bifascicular block. An outpatient telemetry showed recurrent episodes of prolonged asystole associated with syncope.  He underwent a dual-chamber Medtronic pacemaker placement by Dr. Graciela HusbandsKlein in June 2016. Echocardiogram showed normal LV systolic function with mild aortic regurgitation.  He has been doing well since then with no recurrent syncope or dizziness. He is hard of hearing and has some memory problems.     Past Medical History  Diagnosis Date  . Hyperlipidemia   . Anxiety   . Hypertension   . GERD (gastroesophageal reflux disease)   . Syncope     a. 03/2015 echo: EF 55-60%, no rwma, Gr 1 DD, mild MR;  Robinson. 03/2015 Event monitor w/ documented sinus arrest.  . Dysrhythmia     heart block  . Arthritis   . Presence of permanent cardiac pacemaker 04/05/2015  . HOH (hard of hearing)     Past Surgical History  Procedure Laterality Date  . Cholecystectomy    . Insert / replace / remove pacemaker  04/05/2015  . Ep implantable device N/A 04/05/2015    Procedure: Pacemaker Implant;  Surgeon: Duke SalviaSteven C Klein, MD;  [facility];  Service: Cardiovascular;  Laterality: N/A;     Current Outpatient Prescriptions  Medication Sig Dispense Refill  . cetirizine (ZYRTEC) 10 MG tablet Take 10 mg by mouth daily as needed for allergies.     Marland Kitchen. esomeprazole (NEXIUM) 20 MG capsule Take 20 mg by mouth daily.     Marland Kitchen. LORazepam (ATIVAN) 0.5 MG tablet Take 0.5 mg by mouth 2 (two)  times daily as needed for anxiety.     . simvastatin (ZOCOR) 40 MG tablet Take 40 mg by mouth daily at 6 PM.      No current facility-administered medications for this visit.    Allergies:   Sulfa antibiotics    Social History:  The patient  reports that he has quit smoking. He has never used smokeless tobacco. He reports that he does not drink alcohol or use illicit drugs.   Family History:  The patient's family history includes CAD in his brother; Heart attack in his father; Heart failure in his brother.    ROS:  Please see the history of present illness.   Otherwise, review of systems are positive for none.   All other systems are reviewed and negative.    PHYSICAL EXAM: VS:  BP 110/62 mmHg  Pulse 69  Ht 5\' 7"  (1.702 m)  Wt 148 lb 8 oz (67.359 kg)  BMI 23.25 kg/m2 , BMI Body mass index is 23.25 kg/(m^2). GEN: Well nourished, well developed, in no acute distress HEENT: normal Neck: no JVD, carotid bruits, or masses Cardiac: RRR; no murmurs, rubs, or gallops,no edema  Respiratory:  clear to auscultation bilaterally, normal work of breathing GI: soft, nontender, nondistended, + BS MS: no deformity or atrophy Skin: warm and dry, no rash Neuro:  Strength and sensation  are intact Psych: euthymic mood, full affect   EKG:  EKG is ordered today. The ekg ordered today demonstrates normal sinus rhythm with bifascicular block (right bundle branch block and left anterior fascicular block)  Recent Labs: 04/03/2015: BUN 14; Creatinine, Ser 1.41*; Hemoglobin 12.5*; Platelets 256; Potassium 4.0; Sodium 138    Lipid Panel No results found for: CHOL, TRIG, HDL, CHOLHDL, VLDL, LDLCALC, LDLDIRECT    Wt Readings from Last 3 Encounters:  12/24/15 148 lb 8 oz (67.359 kg)  07/05/15 148 lb 8 oz (67.359 kg)  04/29/15 150 lb 8 oz (68.266 kg)        ASSESSMENT AND PLAN:  1.  Sinus node dysfunction: Status post dual-chamber pacemaker placement. No recurrent syncopal episodes since then  and he seems to be stable.  2.  Essential hypertension: Seems to be controlled without medications.  3. Hyperlipidemia: On simvastatin and followed by his primary care physician.    Disposition:   FU with me in 6 months  Signed, Phillip Bears, MD  12/24/2015 5:31 PM    [facility]

## 2015-12-24 NOTE — Patient Instructions (Signed)
Medication Instructions: No changes.   Labwork: None.   Procedures/Testing: None.   Follow-Up: 6 months with Dr. Cristen Murcia.   Any Additional Special Instructions Will Be Listed Below (If Applicable).     If you need a refill on your cardiac medications before your next appointment, please call your pharmacy.   

## 2016-01-06 ENCOUNTER — Ambulatory Visit (INDEPENDENT_AMBULATORY_CARE_PROVIDER_SITE_OTHER): Payer: Medicare Other | Admitting: *Deleted

## 2016-01-06 ENCOUNTER — Telehealth: Payer: Self-pay | Admitting: Cardiology

## 2016-01-06 DIAGNOSIS — I442 Atrioventricular block, complete: Secondary | ICD-10-CM | POA: Diagnosis not present

## 2016-01-06 NOTE — Telephone Encounter (Signed)
Confirmed remote transmission w/ pt wife.   

## 2016-01-06 NOTE — Progress Notes (Signed)
Remote pacemaker transmission.   

## 2016-01-25 ENCOUNTER — Encounter: Payer: Self-pay | Admitting: Cardiology

## 2016-01-25 LAB — CUP PACEART REMOTE DEVICE CHECK
Brady Statistic AP VS Percent: 1.56 %
Brady Statistic AS VP Percent: 0.03 %
Brady Statistic RV Percent Paced: 5.35 %
Date Time Interrogation Session: 20170316143442
Implantable Lead Implant Date: 20160613
Implantable Lead Location: 753859
Implantable Lead Model: 5076
Lead Channel Impedance Value: 456 Ohm
Lead Channel Impedance Value: 532 Ohm
Lead Channel Impedance Value: 532 Ohm
Lead Channel Pacing Threshold Amplitude: 0.625 V
Lead Channel Pacing Threshold Pulse Width: 0.4 ms
Lead Channel Sensing Intrinsic Amplitude: 2.25 mV
Lead Channel Sensing Intrinsic Amplitude: 6.5 mV
Lead Channel Setting Pacing Amplitude: 1.5 V
Lead Channel Setting Pacing Amplitude: 2 V
Lead Channel Setting Sensing Sensitivity: 1.2 mV
MDC IDC LEAD IMPLANT DT: 20160613
MDC IDC LEAD LOCATION: 753860
MDC IDC MSMT BATTERY REMAINING LONGEVITY: 125 mo
MDC IDC MSMT BATTERY VOLTAGE: 3.03 V
MDC IDC MSMT LEADCHNL RA IMPEDANCE VALUE: 399 Ohm
MDC IDC MSMT LEADCHNL RA PACING THRESHOLD AMPLITUDE: 0.75 V
MDC IDC MSMT LEADCHNL RA SENSING INTR AMPL: 2.25 mV
MDC IDC MSMT LEADCHNL RV PACING THRESHOLD PULSEWIDTH: 0.4 ms
MDC IDC MSMT LEADCHNL RV SENSING INTR AMPL: 6.5 mV
MDC IDC SET LEADCHNL RV PACING PULSEWIDTH: 0.4 ms
MDC IDC STAT BRADY AP VP PERCENT: 5.31 %
MDC IDC STAT BRADY AS VS PERCENT: 93.09 %
MDC IDC STAT BRADY RA PERCENT PACED: 6.88 %

## 2016-05-02 ENCOUNTER — Ambulatory Visit (INDEPENDENT_AMBULATORY_CARE_PROVIDER_SITE_OTHER): Payer: Medicare Other | Admitting: Internal Medicine

## 2016-05-02 ENCOUNTER — Encounter: Payer: Self-pay | Admitting: Internal Medicine

## 2016-05-02 VITALS — BP 133/77 | HR 76 | Ht 67.0 in | Wt 150.2 lb

## 2016-05-02 DIAGNOSIS — R55 Syncope and collapse: Secondary | ICD-10-CM | POA: Diagnosis not present

## 2016-05-02 DIAGNOSIS — I442 Atrioventricular block, complete: Secondary | ICD-10-CM | POA: Diagnosis not present

## 2016-05-02 DIAGNOSIS — Z959 Presence of cardiac and vascular implant and graft, unspecified: Secondary | ICD-10-CM | POA: Diagnosis not present

## 2016-05-02 NOTE — Patient Instructions (Addendum)
Medication Instructions: - Your physician recommends that you continue on your current medications as directed. Please refer to the Current Medication list given to you today.  Labwork: - none  Procedures/Testing: - none  Follow-Up: - Remote monitoring is used to monitor your Pacemaker of ICD from home. This monitoring reduces the number of office visits required to check your device to one time per year. It allows us to keep an eye on the functioning of your device to ensure it is working properly. You are scheduled for a device check from home on 08/01/16. You may send your transmission at any time that day. If you have a wireless device, the transmission will be sent automatically. After your physician reviews your transmission, you will receive a postcard with your next transmission date.  - Your physician wants you to follow-up in: 1 year with Dr. Graciela HusbandsKlein. You will receive a reminder letter in the mail two months in advance. If you don't receive a letter, please call our office to schedule the follow-up appointment.  Any Additional Special Instructions Will Be Listed Below (If Applicable).     If you need a refill on your cardiac medications before your next appointment, please call your pharmacy.

## 2016-05-02 NOTE — Progress Notes (Signed)
      Patient Care Team: Rafael BihariJohn B Walker III, MD as PCP - General (Internal Medicine)   HPI  Phillip SimpersGurney L Robinson is a 82 y.o. male seen in follow-up for pacemaker implanted 6/16 for adam stokes attacks in the context of underlying bifascicular block   The patient denies chest pain, shortness of breath, nocturnal dyspnea, orthopnea or peripheral edema.  There have been no palpitations  He had recurrent syncope last week having gone to the dentist. He was standing. She started having burping which has been associated with syncope past. He got very hot followed by the development of a cold sweat. He then lost consciousness while flat.  Echocardiogram 6/16 normal LV function  Records and Results Reviewed  outside office records  Past Medical History  Diagnosis Date  . Hyperlipidemia   . Anxiety   . Hypertension   . GERD (gastroesophageal reflux disease)   . Syncope     a. 03/2015 echo: EF 55-60%, no rwma, Gr 1 DD, mild MR;  b. 03/2015 Event monitor w/ documented sinus arrest.  . Dysrhythmia     heart block  . Arthritis   . Presence of permanent cardiac pacemaker 04/05/2015  . HOH (hard of hearing)     Past Surgical History  Procedure Laterality Date  . Cholecystectomy    . Insert / replace / remove pacemaker  04/05/2015  . Ep implantable device N/A 04/05/2015    Procedure: Pacemaker Implant;  Surgeon: Duke SalviaSteven C Kenyada Hy, MD;  [facility];  Service: Cardiovascular;  Laterality: N/A;  . Tooth extraction      Current Outpatient Prescriptions  Medication Sig Dispense Refill  . cetirizine (ZYRTEC) 10 MG tablet Take 10 mg by mouth daily as needed for allergies.     Marland Kitchen. esomeprazole (NEXIUM) 20 MG capsule Take 20 mg by mouth daily.     Marland Kitchen. LORazepam (ATIVAN) 0.5 MG tablet Take 0.5 mg by mouth 2 (two) times daily as needed for anxiety.     . simvastatin (ZOCOR) 40 MG tablet Take 40 mg by mouth daily at 6 PM.      No current facility-administered medications for this visit.     Allergies  Allergen Reactions  . Sulfa Antibiotics Other (See Comments)    Reaction:  Unknown       Review of Systems negative except from HPI and PMH  Physical Exam BP 133/77 mmHg  Pulse 76  Ht 5\' 7"  (1.702 m)  Wt 150 lb 4 oz (68.153 kg)  BMI 23.53 kg/m2 Well developed and well nourished in no acute distress HENT normal E scleral and icterus clear Neck Supple JVP flat; carotids brisk and full Clear to ausculation Device pocket well healed; without hematoma or erythema.  There is no tethering  Regular rate and rhythm, no murmurs gallops or rub Soft with active bowel sounds No clubbing cyanosis  Edema Alert and oriented, grossly normal motor and sensory function Skin Warm and Dry  ECG demonstrates sinus rhythm at 76 interval 17/14/40 Axis -61  Assessment and  Plan  Bifascicular block  Syncope  Intermittent complete heart block  Pacemaker  ,The patient's device was interrogated and the information was fully reviewed.  The device was reprogrammed to maximize longevity   His recurrent syncopal episode is associated with epiphenomena suggesting neurally mediated episode as to the circumstances. The fact that it was reminiscent suggest this might be primary underlying pathophysiology. We have reprogrammed the pacemaker to activate the rate drop algorithm

## 2016-05-08 LAB — CUP PACEART INCLINIC DEVICE CHECK
Battery Voltage: 3.03 V
Brady Statistic AP VP Percent: 5.04 %
Brady Statistic AS VP Percent: 0.04 %
Brady Statistic AS VS Percent: 93.51 %
Brady Statistic RA Percent Paced: 6.45 %
Date Time Interrogation Session: 20170711160946
Implantable Lead Implant Date: 20160613
Implantable Lead Implant Date: 20160613
Implantable Lead Location: 753859
Implantable Lead Model: 5076
Lead Channel Impedance Value: 399 Ohm
Lead Channel Impedance Value: 456 Ohm
Lead Channel Impedance Value: 532 Ohm
Lead Channel Pacing Threshold Amplitude: 0.625 V
Lead Channel Pacing Threshold Amplitude: 0.875 V
Lead Channel Sensing Intrinsic Amplitude: 6.875 mV
Lead Channel Setting Pacing Amplitude: 1.75 V
Lead Channel Setting Pacing Pulse Width: 0.4 ms
MDC IDC LEAD LOCATION: 753860
MDC IDC MSMT BATTERY REMAINING LONGEVITY: 119 mo
MDC IDC MSMT LEADCHNL RA PACING THRESHOLD PULSEWIDTH: 0.4 ms
MDC IDC MSMT LEADCHNL RA SENSING INTR AMPL: 2.5 mV
MDC IDC MSMT LEADCHNL RA SENSING INTR AMPL: 3.875 mV
MDC IDC MSMT LEADCHNL RV IMPEDANCE VALUE: 513 Ohm
MDC IDC MSMT LEADCHNL RV PACING THRESHOLD PULSEWIDTH: 0.4 ms
MDC IDC MSMT LEADCHNL RV SENSING INTR AMPL: 6.375 mV
MDC IDC SET LEADCHNL RV PACING AMPLITUDE: 2 V
MDC IDC SET LEADCHNL RV SENSING SENSITIVITY: 1.2 mV
MDC IDC STAT BRADY AP VS PERCENT: 1.41 %
MDC IDC STAT BRADY RV PERCENT PACED: 5.08 %

## 2016-05-11 ENCOUNTER — Encounter: Payer: Self-pay | Admitting: Internal Medicine

## 2016-05-11 IMAGING — CT CT HEAD W/O CM
2 series · 14 of 30 positions shown, 16 images · non-contrast
Comparison: 03/08/2015

CLINICAL DATA: Status post fall.  Loss of consciousness.

EXAM:
CT HEAD WITHOUT CONTRAST
TECHNIQUE: Contiguous axial images were obtained from the base of the skull
through the vertex without intravenous contrast.

[Series 2: head wo · axial · 0.40mm/px · z∈[+226,+320]mm · 6 of 27 slices shown, 8 images]
[im 4/27  brain]
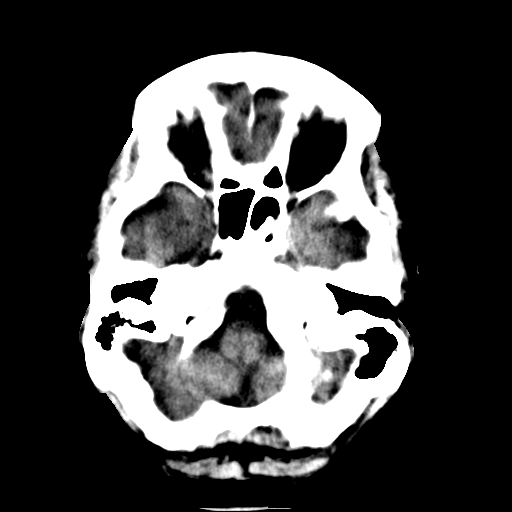
[im 4/27  bone]
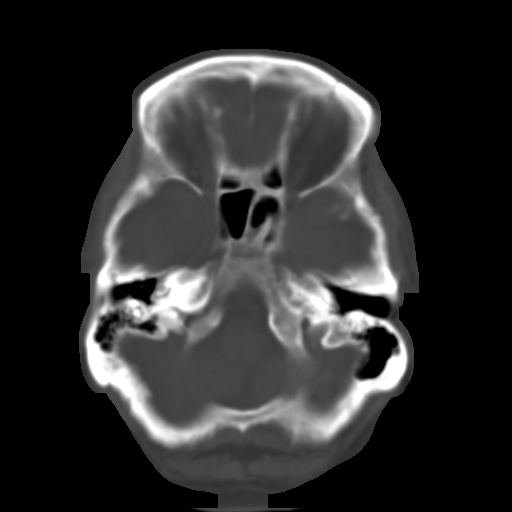
[im 8/27  brain]
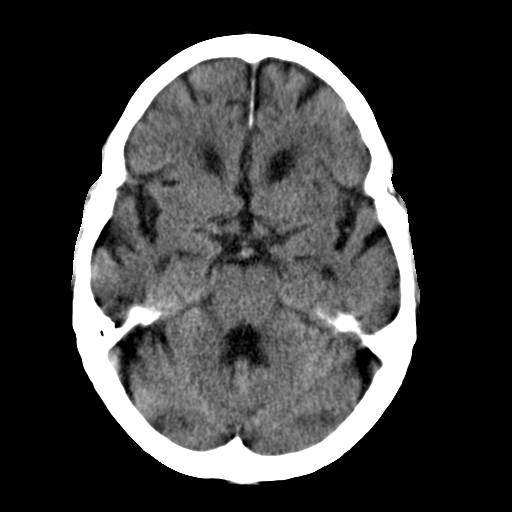
[im 12/27  brain]
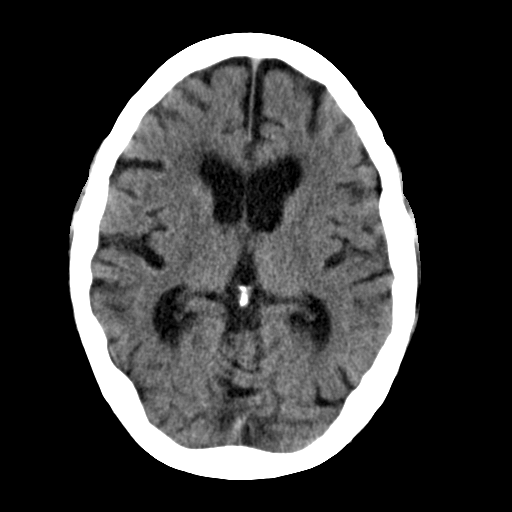
[im 15/27  brain]
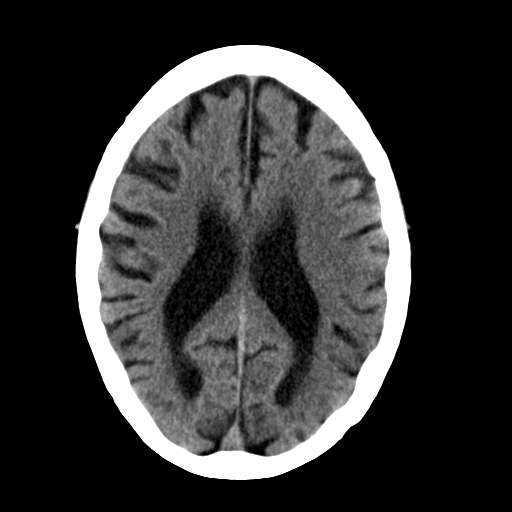
[im 19/27  brain]
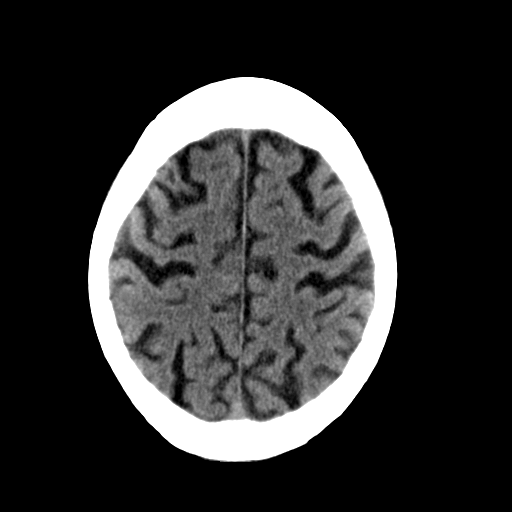
[im 19/27  bone]
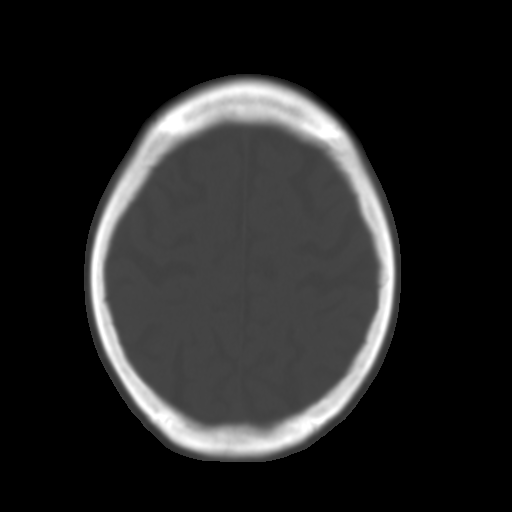
[im 23/27  brain]
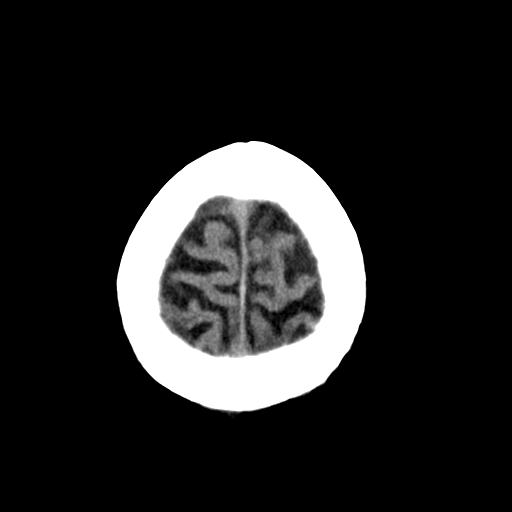

[Series 3: head bone · axial · 0.40mm/px · z∈[+214,+336]mm · 8 of 77 slices shown]
[im 8/77  bone]
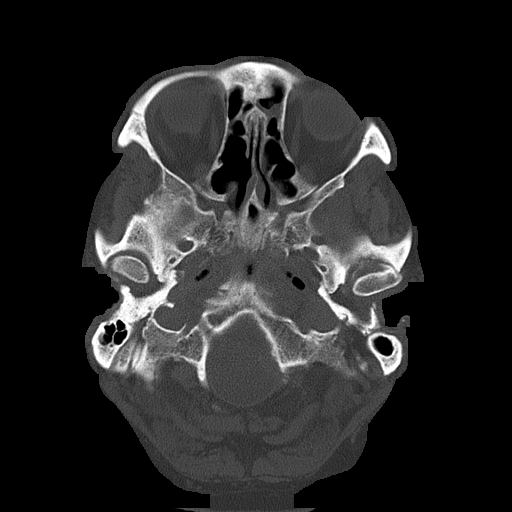
[im 15/77  bone]
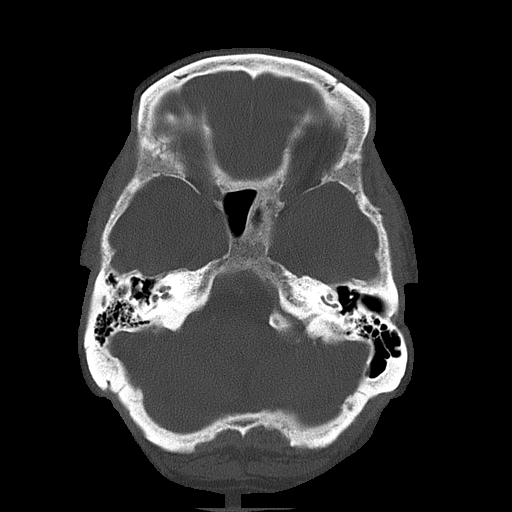
[im 26/77  bone]
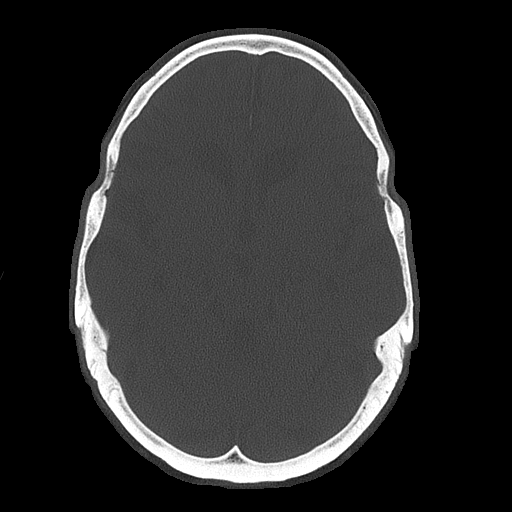
[im 33/77  bone]
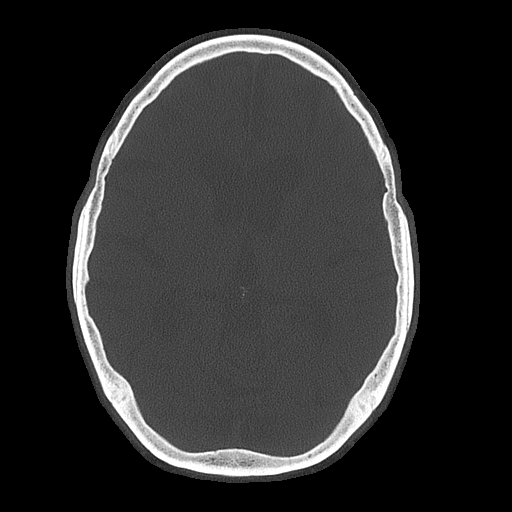
[im 44/77  bone]
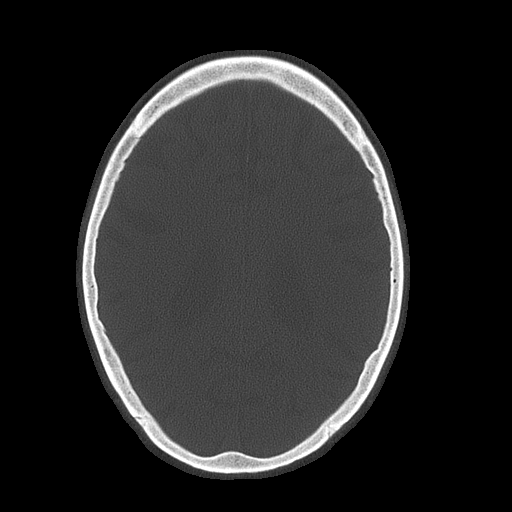
[im 51/77  bone]
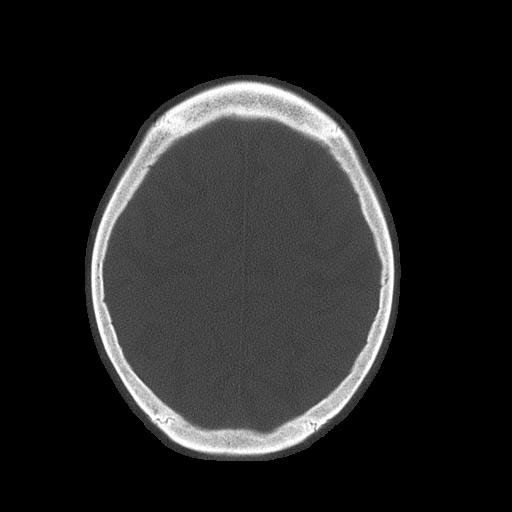
[im 62/77  bone]
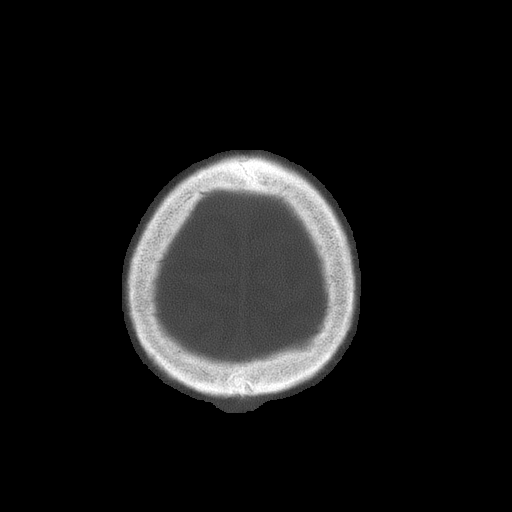
[im 69/77  bone]
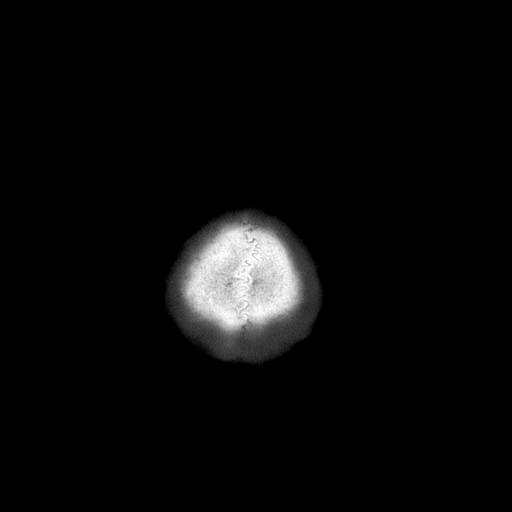

[14 of 30 positions shown; findings below may reference images not displayed]

FINDINGS: There is no evidence of mass effect, midline shift, or extra-axial
fluid collections. There is no evidence of a space-occupying lesion
or intracranial hemorrhage. There is no evidence of a cortical-based
area of acute infarction. There are old bilateral basal ganglia
lacunar infarct. There is generalized cerebral atrophy. There is
periventricular white matter low attenuation likely secondary to
microangiopathy.

The ventricles and sulci are appropriate for the patient's age. The
basal cisterns are patent.

Visualized portions of the orbits are unremarkable. There is a small
air-fluid level in the left sphenoid sinus. The mastoid sinuses are
clear. Cerebrovascular atherosclerotic calcifications are noted.

The osseous structures are unremarkable.
IMPRESSION: 1.   1.  No acute intracranial pathology.
2. Chronic microvascular disease and cerebral atrophy.
2.

## 2016-05-15 IMAGING — DX DG CHEST 2V
2 series · 2 of 2 positions shown · non-contrast
Comparison: None.

CLINICAL DATA: Insertion of pacemaker.  Complete heart block.

EXAM:
CHEST  2 VIEW

[chest pa]
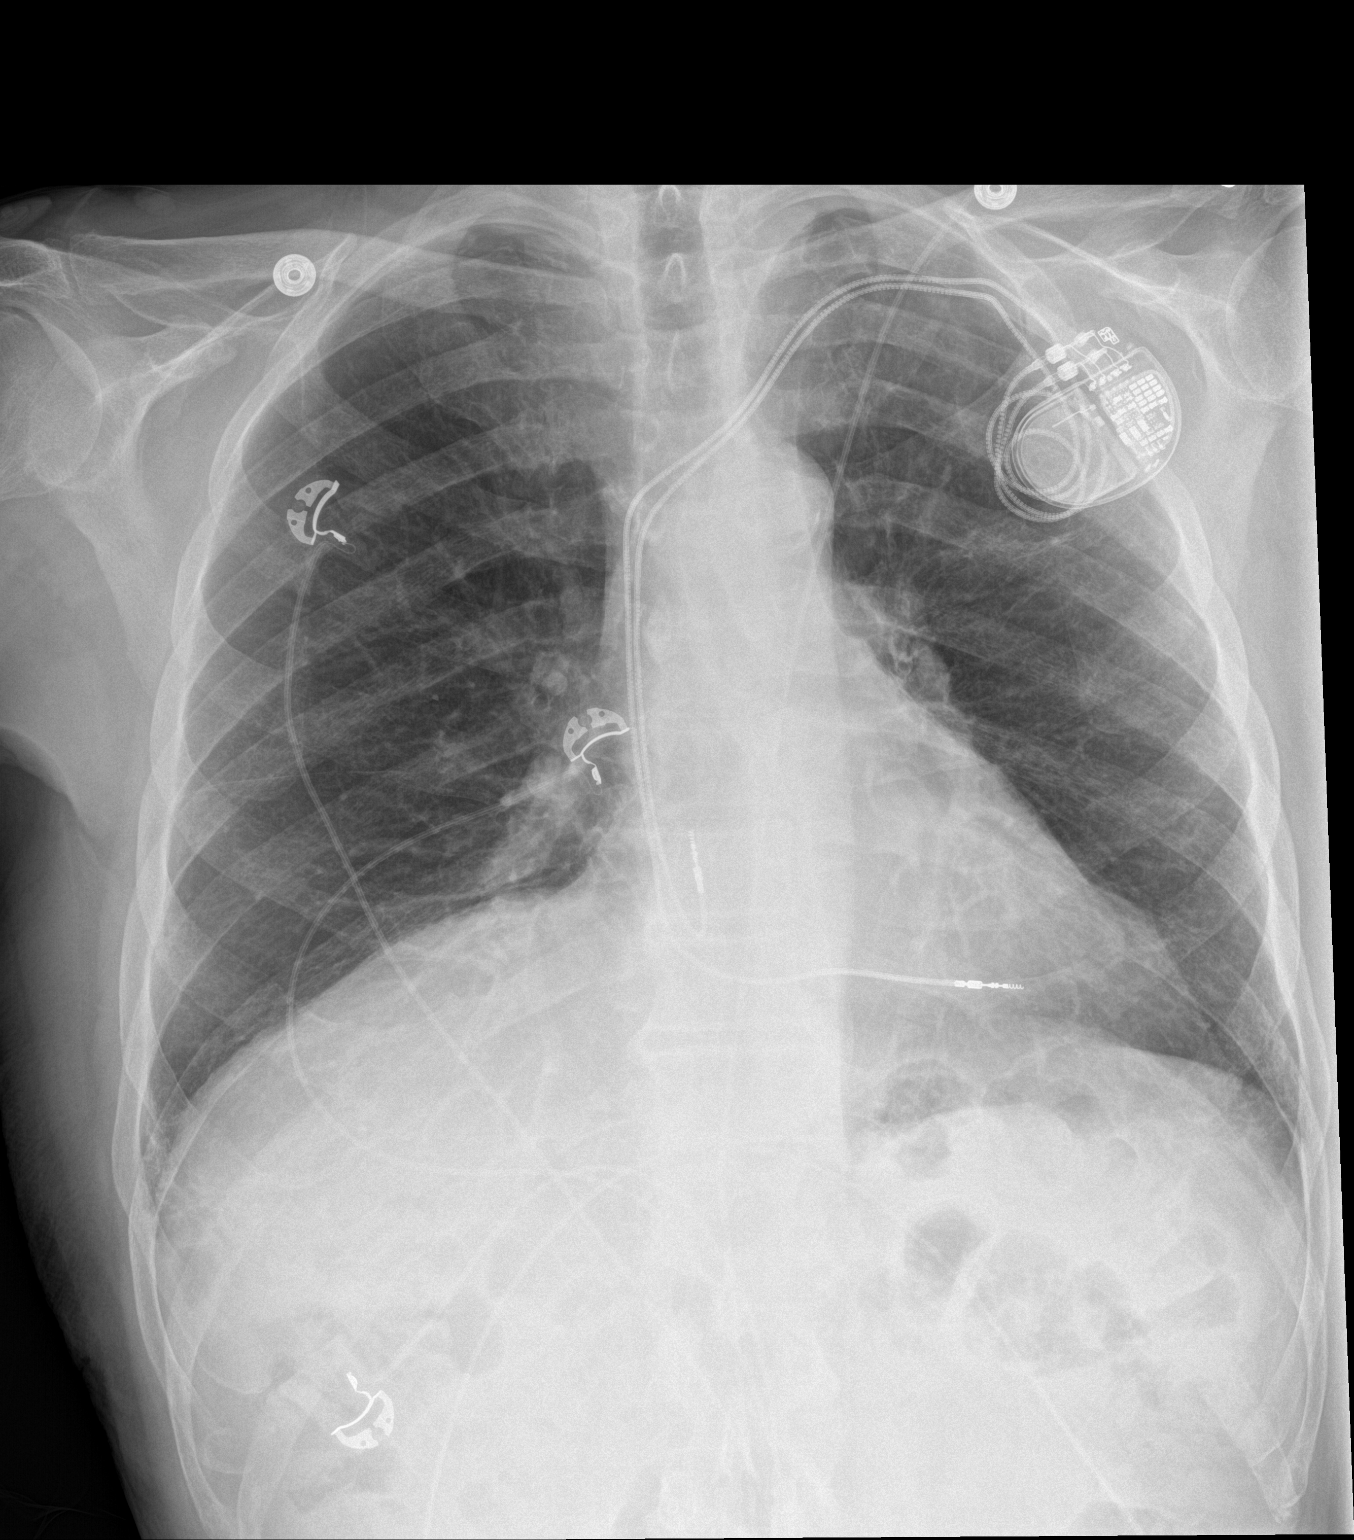

[chest lat]
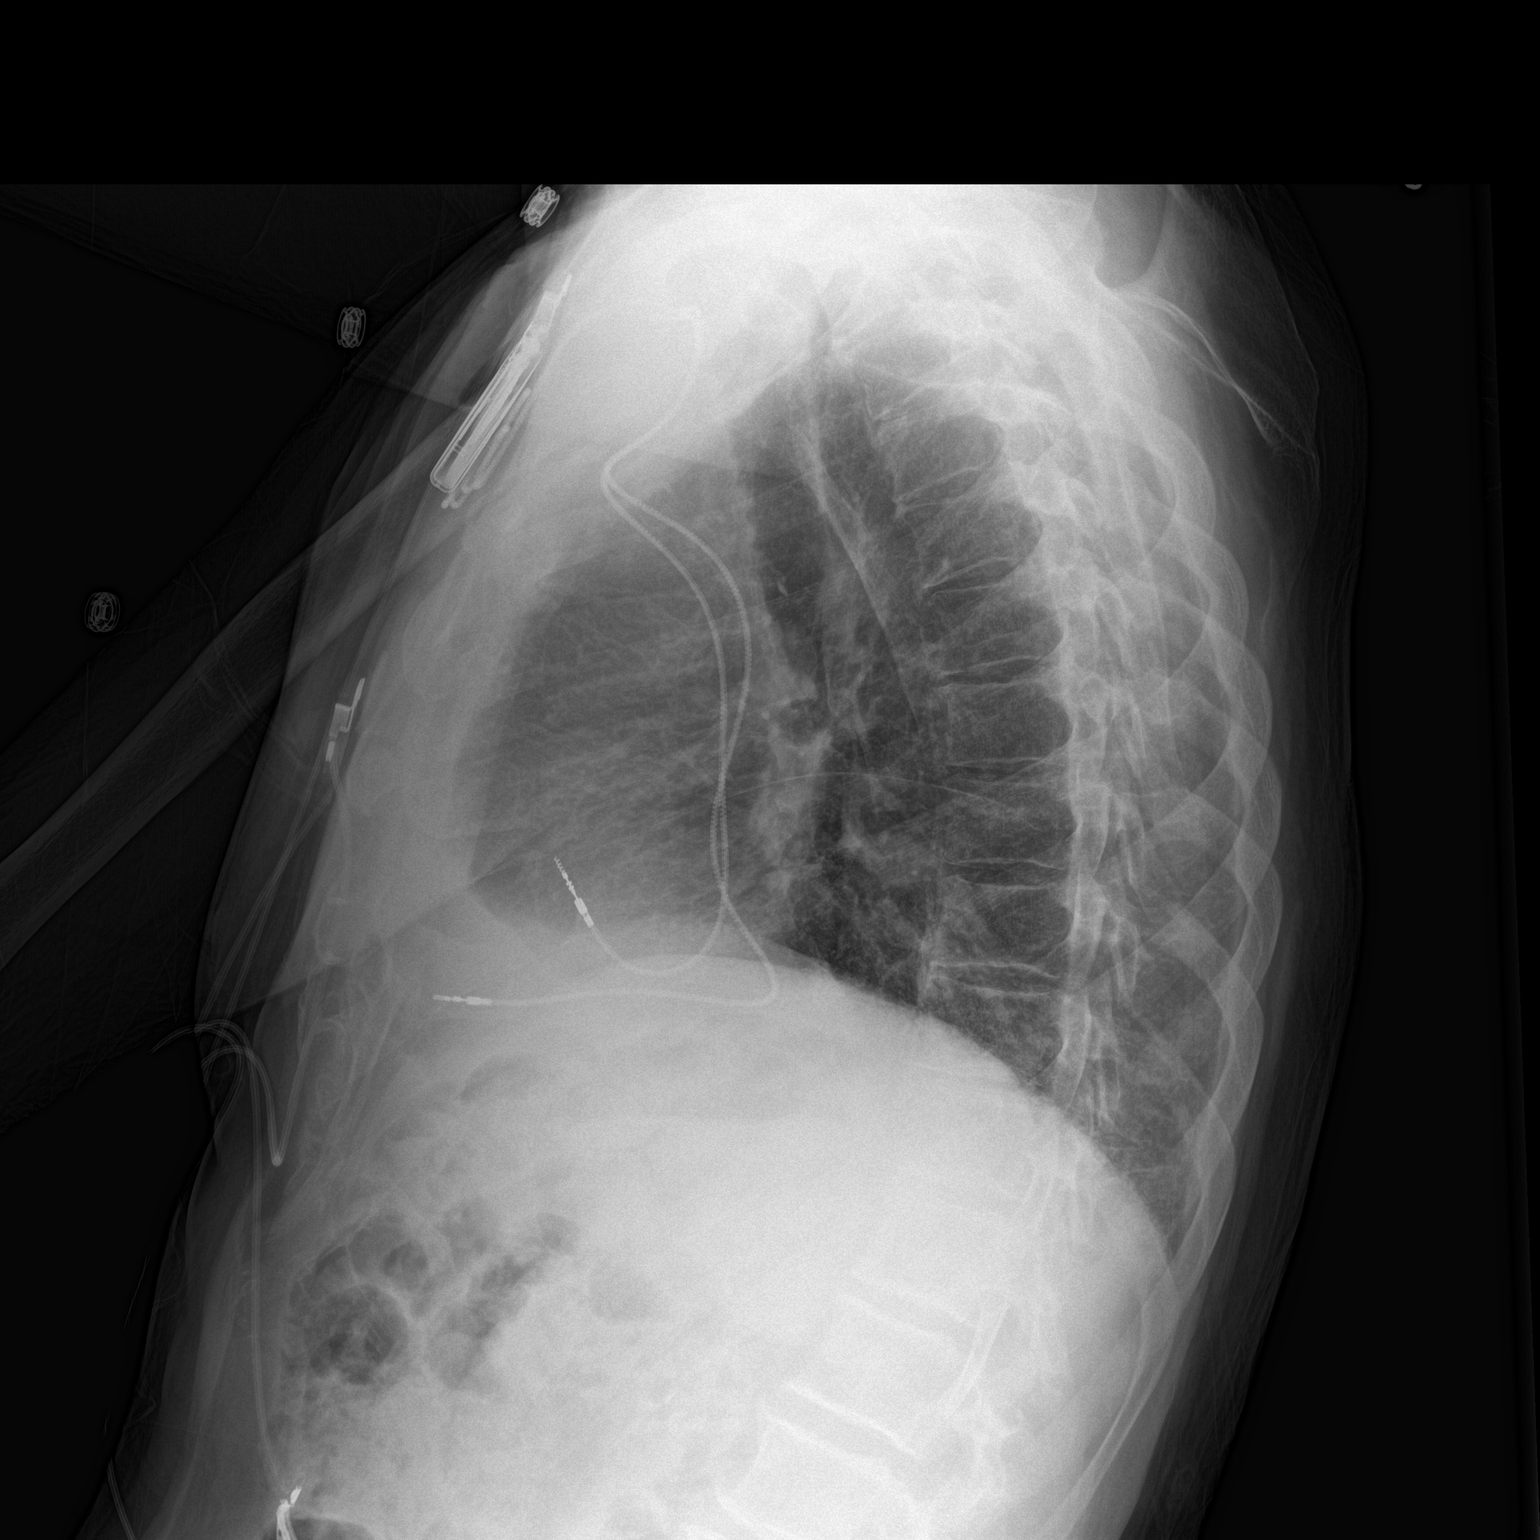

[2 of 2 positions shown; findings below may reference images not displayed]

FINDINGS: Dual lead pacemaker in place. No pneumothorax. Heart size and
pulmonary vascularity are normal and the lungs are clear. No
effusions. No acute osseous abnormality.
IMPRESSION: No acute abnormality.  Pacemaker in place.

## 2016-06-19 ENCOUNTER — Ambulatory Visit (INDEPENDENT_AMBULATORY_CARE_PROVIDER_SITE_OTHER): Payer: Medicare Other | Admitting: Cardiovascular Disease

## 2016-06-19 ENCOUNTER — Encounter: Payer: Self-pay | Admitting: Cardiovascular Disease

## 2016-06-19 VITALS — BP 108/68 | HR 64 | Ht 67.0 in | Wt 147.4 lb

## 2016-06-19 DIAGNOSIS — I442 Atrioventricular block, complete: Secondary | ICD-10-CM | POA: Diagnosis not present

## 2016-06-19 DIAGNOSIS — Z950 Presence of cardiac pacemaker: Secondary | ICD-10-CM | POA: Diagnosis not present

## 2016-06-19 NOTE — Progress Notes (Signed)
Cardiology Office Note   Date:  06/19/2016   ID:  Phillip Robinson, DOB [birth year], MRN [id number]  PCP:  Rafael Bihari, MD  Cardiologist:   Lorine Bears, MD   No chief complaint on file.     History of Present Illness: Phillip Robinson is a 82 y.o. male who presents for a follow-up visit regarding sinus node dysfunction status post pacemaker placement.  He had recurrent syncope last year with underlying bifascicular block. An outpatient telemetry showed recurrent episodes of prolonged asystole associated with syncope.  He underwent a dual-chamber Medtronic pacemaker placement by Dr. Graciela Husbands in June 2016. Echocardiogram showed normal LV systolic function with mild aortic regurgitation.  He has been doing well since then with no recurrent syncope or dizziness. He is hard of hearing and has some memory problems.  He denies any chest pain, shortness of breath, orthopnea or PND.    Past Medical History:  Diagnosis Date  . Anxiety   . Arthritis   . Dysrhythmia    heart block  . GERD (gastroesophageal reflux disease)   . HOH (hard of hearing)   . Hyperlipidemia   . Hypertension   . Presence of permanent cardiac pacemaker 04/05/2015  . Syncope    a. 03/2015 echo: EF 55-60%, no rwma, Gr 1 DD, mild MR;  b. 03/2015 Event monitor w/ documented sinus arrest.    Past Surgical History:  Procedure Laterality Date  . CHOLECYSTECTOMY    . EP IMPLANTABLE DEVICE N/A 04/05/2015   Procedure: Pacemaker Implant;  Surgeon: Duke Salvia, MD;  Location: [facility] INVASIVE CV LAB;  Service: Cardiovascular;  Laterality: N/A;  . INSERT / REPLACE / REMOVE PACEMAKER  04/05/2015  . TOOTH EXTRACTION       Current Outpatient Prescriptions  Medication Sig Dispense Refill  . cetirizine (ZYRTEC) 10 MG tablet Take 10 mg by mouth daily as needed for allergies.     Marland Kitchen esomeprazole (NEXIUM) 20 MG capsule Take 20 mg by mouth daily.     Marland Kitchen LORazepam (ATIVAN) 0.5 MG tablet Take 0.5 mg by mouth 2 (two) times daily  as needed for anxiety.     . simvastatin (ZOCOR) 40 MG tablet Take 40 mg by mouth daily at 6 PM.      No current facility-administered medications for this visit.     Allergies:   Sulfa antibiotics    Social History:  The patient  reports that he has quit smoking. He has never used smokeless tobacco. He reports that he does not drink alcohol or use drugs.   Family History:  The patient's family history includes CAD in his brother; Heart attack in his father; Heart failure in his brother.    ROS:  Please see the history of present illness.   Otherwise, review of systems are positive for none.   All other systems are reviewed and negative.    PHYSICAL EXAM: VS:  BP 108/68   Pulse 64   Ht 5\' 7"  (1.702 m)   Wt 147 lb 6.4 oz (66.9 kg)   BMI 23.09 kg/m  , BMI Body mass index is 23.09 kg/m. GEN: Well nourished, well developed, in no acute distress HEENT: normal Neck: no JVD, carotid bruits, or masses Cardiac: RRR; no murmurs, rubs, or gallops,no edema  Respiratory:  clear to auscultation bilaterally, normal work of breathing GI: soft, nontender, nondistended, + BS MS: no deformity or atrophy Skin: warm and dry, no rash Neuro:  Strength and sensation are intact Psych:  euthymic mood, full affect   EKG:  EKG is not ordered today.   Recent Labs: No results found for requested labs within last 8760 hours.    Lipid Panel No results found for: CHOL, TRIG, HDL, CHOLHDL, VLDL, LDLCALC, LDLDIRECT    Wt Readings from Last 3 Encounters:  06/19/16 147 lb 6.4 oz (66.9 kg)  05/02/16 150 lb 4 oz (68.2 kg)  12/24/15 148 lb 8 oz (67.4 kg)        ASSESSMENT AND PLAN:  1.  Sinus node dysfunction: Status post dual-chamber pacemaker placement. No recurrent syncopal episodes since then and he seems to be stable. He is being followed by Dr. Graciela HusbandsKlein on a yearly basis with remote monitoring of the pacemaker. Thus, he can follow-up with me as needed.  2.  Essential hypertension: Seems to be  controlled without medications.  3. Hyperlipidemia: On simvastatin and followed by his primary care physician.    Signed, Lorine BearsMuhammad Luwana Butrick, MD  06/19/2016 2:10 PM    [facility]

## 2016-06-19 NOTE — Patient Instructions (Signed)
Medication Instructions: Continue same medications.   Labwork: None.   Procedures/Testing: None.   Follow-Up: Continue to follow up with Dr. Graciela HusbandsKlein for the pacemaker.   Any Additional Special Instructions Will Be Listed Below (If Applicable).     If you need a refill on your cardiac medications before your next appointment, please call your pharmacy.

## 2016-08-01 ENCOUNTER — Ambulatory Visit (INDEPENDENT_AMBULATORY_CARE_PROVIDER_SITE_OTHER): Payer: Medicare Other | Admitting: *Deleted

## 2016-08-01 DIAGNOSIS — I442 Atrioventricular block, complete: Secondary | ICD-10-CM

## 2016-08-01 NOTE — Progress Notes (Signed)
Remote pacemaker transmission.   

## 2016-08-02 ENCOUNTER — Encounter: Payer: Self-pay | Admitting: Cardiology

## 2016-08-31 LAB — CUP PACEART REMOTE DEVICE CHECK
Battery Remaining Longevity: 109 mo
Battery Voltage: 3.02 V
Brady Statistic AP VP Percent: 0.15 %
Brady Statistic AP VS Percent: 0.05 %
Brady Statistic AS VS Percent: 99.75 %
Implantable Lead Implant Date: 20160613
Implantable Lead Model: 5076
Implantable Lead Model: 5076
Implantable Pulse Generator Implant Date: 20160613
Lead Channel Impedance Value: 399 Ohm
Lead Channel Impedance Value: 532 Ohm
Lead Channel Pacing Threshold Amplitude: 0.625 V
Lead Channel Pacing Threshold Amplitude: 0.625 V
Lead Channel Pacing Threshold Pulse Width: 0.4 ms
Lead Channel Sensing Intrinsic Amplitude: 2.375 mV
Lead Channel Sensing Intrinsic Amplitude: 6.25 mV
Lead Channel Setting Pacing Amplitude: 2 V
MDC IDC LEAD IMPLANT DT: 20160613
MDC IDC LEAD LOCATION: 753859
MDC IDC LEAD LOCATION: 753860
MDC IDC MSMT LEADCHNL RA PACING THRESHOLD PULSEWIDTH: 0.4 ms
MDC IDC MSMT LEADCHNL RA SENSING INTR AMPL: 2.375 mV
MDC IDC MSMT LEADCHNL RV IMPEDANCE VALUE: 437 Ohm
MDC IDC MSMT LEADCHNL RV IMPEDANCE VALUE: 513 Ohm
MDC IDC MSMT LEADCHNL RV SENSING INTR AMPL: 6.25 mV
MDC IDC SESS DTM: 20171010151601
MDC IDC SET LEADCHNL RA PACING AMPLITUDE: 1.5 V
MDC IDC SET LEADCHNL RV PACING PULSEWIDTH: 0.4 ms
MDC IDC SET LEADCHNL RV SENSING SENSITIVITY: 1.2 mV
MDC IDC STAT BRADY AS VP PERCENT: 0.04 %
MDC IDC STAT BRADY RA PERCENT PACED: 0.21 %
MDC IDC STAT BRADY RV PERCENT PACED: 0.2 %

## 2016-10-31 ENCOUNTER — Ambulatory Visit (INDEPENDENT_AMBULATORY_CARE_PROVIDER_SITE_OTHER): Payer: Medicare Other | Admitting: *Deleted

## 2016-10-31 DIAGNOSIS — I442 Atrioventricular block, complete: Secondary | ICD-10-CM | POA: Diagnosis not present

## 2016-10-31 NOTE — Progress Notes (Signed)
Remote pacemaker transmission.   

## 2016-11-01 ENCOUNTER — Encounter: Payer: Self-pay | Admitting: Cardiology

## 2016-11-02 LAB — CUP PACEART REMOTE DEVICE CHECK
Battery Remaining Longevity: 109 mo
Battery Voltage: 3.02 V
Brady Statistic AP VS Percent: 0.02 %
Brady Statistic AS VS Percent: 99.84 %
Brady Statistic RV Percent Paced: 0.13 %
Implantable Lead Implant Date: 20160613
Implantable Lead Location: 753860
Implantable Lead Model: 5076
Lead Channel Impedance Value: 513 Ohm
Lead Channel Pacing Threshold Amplitude: 0.5 V
Lead Channel Pacing Threshold Amplitude: 0.625 V
Lead Channel Pacing Threshold Pulse Width: 0.4 ms
Lead Channel Sensing Intrinsic Amplitude: 2.125 mV
Lead Channel Sensing Intrinsic Amplitude: 2.125 mV
Lead Channel Sensing Intrinsic Amplitude: 5.75 mV
Lead Channel Setting Pacing Amplitude: 2 V
Lead Channel Setting Sensing Sensitivity: 1.2 mV
MDC IDC LEAD IMPLANT DT: 20160613
MDC IDC LEAD LOCATION: 753859
MDC IDC MSMT LEADCHNL RA IMPEDANCE VALUE: 399 Ohm
MDC IDC MSMT LEADCHNL RA PACING THRESHOLD PULSEWIDTH: 0.4 ms
MDC IDC MSMT LEADCHNL RV IMPEDANCE VALUE: 437 Ohm
MDC IDC MSMT LEADCHNL RV IMPEDANCE VALUE: 494 Ohm
MDC IDC MSMT LEADCHNL RV SENSING INTR AMPL: 5.75 mV
MDC IDC PG IMPLANT DT: 20160613
MDC IDC SESS DTM: 20180109164119
MDC IDC SET LEADCHNL RA PACING AMPLITUDE: 1.5 V
MDC IDC SET LEADCHNL RV PACING PULSEWIDTH: 0.4 ms
MDC IDC STAT BRADY AP VP PERCENT: 0.07 %
MDC IDC STAT BRADY AS VP PERCENT: 0.06 %
MDC IDC STAT BRADY RA PERCENT PACED: 0.09 %

## 2016-11-29 ENCOUNTER — Ambulatory Visit
Admission: EM | Admit: 2016-11-29 | Discharge: 2016-11-29 | Disposition: A | Discharge status: Home / Self Care | Payer: Medicare Other | Attending: Family Medicine | Admitting: Family Medicine

## 2016-11-29 ENCOUNTER — Ambulatory Visit
Admit: 2016-11-29 | Discharge: 2016-11-29 | Disposition: A | Discharge status: Home / Self Care | Payer: Medicare Other | Attending: Family Medicine | Admitting: Family Medicine

## 2016-11-29 ENCOUNTER — Encounter: Payer: Self-pay | Admitting: Emergency Medicine

## 2016-11-29 ENCOUNTER — Telehealth: Payer: Self-pay | Admitting: Internal Medicine

## 2016-11-29 DIAGNOSIS — Z87898 Personal history of other specified conditions: Secondary | ICD-10-CM | POA: Diagnosis not present

## 2016-11-29 DIAGNOSIS — S0990XA Unspecified injury of head, initial encounter: Secondary | ICD-10-CM | POA: Diagnosis not present

## 2016-11-29 DIAGNOSIS — S01112A Laceration without foreign body of left eyelid and periocular area, initial encounter: Secondary | ICD-10-CM | POA: Diagnosis not present

## 2016-11-29 DIAGNOSIS — S0993XA Unspecified injury of face, initial encounter: Secondary | ICD-10-CM | POA: Diagnosis present

## 2016-11-29 DIAGNOSIS — S0181XA Laceration without foreign body of other part of head, initial encounter: Secondary | ICD-10-CM | POA: Diagnosis not present

## 2016-11-29 DIAGNOSIS — W19XXXA Unspecified fall, initial encounter: Secondary | ICD-10-CM

## 2016-11-29 NOTE — Discharge Instructions (Signed)
Follow up in 8 days for suture removal Monitor for signs/symptoms of wound infection Notify cardiologist of patient's fall episode today

## 2016-11-29 NOTE — ED Provider Notes (Signed)
[facility]    CSN: [id number] Arrival date & time: 11/29/16  [id number]     History   Chief Complaint Chief Complaint  Patient presents with  . Fall  . Facial Laceration    HPI Phillip Robinson is a 83 y.o. male.   83 yo male with a c/o fall this morning with injury to the head and left facial/eyebrow laceration. Patient accompanied by grandson. Patient has a h/o syncopal episodes, has a pacemaker (last checked about 3 weeks ago per grandson) and is followed by cardiologist. Patient states he was in the bathroom over the sink when he briefly got lightheaded, passed out and hit his head on the sink. States he got up quickly and went to get his grandson who was in the house and brought him here. Patient currently denies any vision changes, numbness/tingling, fevers, chills, chest pains, shortness of breath. Complains of pain around the left eye.    The history is provided by the patient.  Fall  This is a recurrent problem. The current episode started 3 to 5 hours ago. Associated symptoms include headaches. Pertinent negatives include no chest pain.    Past Medical History:  Diagnosis Date  . Anxiety   . Arthritis   . Dysrhythmia    heart block  . GERD (gastroesophageal reflux disease)   . HOH (hard of hearing)   . Hyperlipidemia   . Hypertension   . Presence of permanent cardiac pacemaker 04/05/2015  . Syncope    a. 03/2015 echo: EF 55-60%, no rwma, Gr 1 DD, mild MR;  b. 03/2015 Event monitor w/ documented sinus arrest.    Patient Active Problem List   Diagnosis Date Noted  . Status post placement of cardiac pacemaker 04/29/2015  . Cardiac device in situ   . Complete heart block (HCC) 04/05/2015  . Syncope 04/02/2015  . Faintness 03/19/2015  . Essential hypertension 03/19/2015    Past Surgical History:  Procedure Laterality Date  . CHOLECYSTECTOMY    . EP IMPLANTABLE DEVICE N/A 04/05/2015   Procedure: Pacemaker Implant;  Surgeon: Duke SalviaSteven C Klein, MD;  [facility];  Service: Cardiovascular;  Laterality: N/A;  . INSERT / REPLACE / REMOVE PACEMAKER  04/05/2015  . TOOTH EXTRACTION         Home Medications    Prior to Admission medications   Medication Sig Start Date End Date Taking? Authorizing Provider  divalproex (DEPAKOTE) 125 MG DR tablet Take 125 mg by mouth 2 (two) times daily.   Yes Historical Provider, MD  meloxicam (MOBIC) 15 MG tablet Take 15 mg by mouth daily.   Yes Historical Provider, MD  cetirizine (ZYRTEC) 10 MG tablet Take 10 mg by mouth daily as needed for allergies.     Historical Provider, MD  esomeprazole (NEXIUM) 20 MG capsule Take 20 mg by mouth daily.     Historical Provider, MD  LORazepam (ATIVAN) 0.5 MG tablet Take 0.5 mg by mouth 2 (two) times daily as needed for anxiety.     Historical Provider, MD  simvastatin (ZOCOR) 40 MG tablet Take 40 mg by mouth daily at 6 PM.     Historical Provider, MD    Family History Family History  Problem Relation Age of Onset  . Heart attack Father     died @ 5662.  Marland Kitchen. CAD Brother   . Heart failure Brother     Social History Social History  Substance Use Topics  . Smoking status: Former Games developermoker  .  Smokeless tobacco: Never Used     Comment: quit smoking in the 80's  . Alcohol use No     Allergies   Sulfa antibiotics   Review of Systems Review of Systems  Cardiovascular: Negative for chest pain.  Neurological: Positive for headaches.     Physical Exam Triage Vital Signs ED Triage Vitals  Enc Vitals Group     BP 11/29/16 0933 109/81     Pulse Rate 11/29/16 0933 68     Resp 11/29/16 0933 16     Temp 11/29/16 0933 97.6 F (36.4 C)     Temp Source 11/29/16 0933 Oral     SpO2 11/29/16 0933 97 %     Weight 11/29/16 0933 147 lb (66.7 kg)     Height 11/29/16 0933 5\' 10"  (1.778 m)     Head Circumference --      Peak Flow --      Pain Score 11/29/16 0936 3     Pain Loc --      Pain Edu? --      Excl. in GC? --    No data found.   Updated Vital  Signs BP 109/81 (BP Location: Left Arm)   Pulse 68   Temp 97.6 F (36.4 C) (Oral)   Resp 16   Ht 5\' 10"  (1.778 m)   Wt 147 lb (66.7 kg)   SpO2 97%   BMI 21.09 kg/m   Visual Acuity Right Eye Distance:   Left Eye Distance:   Bilateral Distance:    Right Eye Near:   Left Eye Near:    Bilateral Near:     Physical Exam  Constitutional: He is oriented to person, place, and time. He appears well-developed and well-nourished. No distress.  HENT:  Head: Normocephalic.  Right Ear: External ear normal.  Left Ear: External ear normal.  Nose: Nose normal.  Mouth/Throat: Oropharynx is clear and moist. No oropharyngeal exudate.  Eyes: EOM are normal. Pupils are equal, round, and reactive to light. Right eye exhibits no discharge. Left eye exhibits no discharge.    2-2.5 cm laceration over the left eyebrow;  Ecchymosis around the left eye soft tissues and tenderness to palpation over the forehead and periorbital bone and zygomatic arch  Neurological: He is alert and oriented to person, place, and time. He displays normal reflexes. No cranial nerve deficit or sensory deficit. He exhibits normal muscle tone. Coordination normal.  Skin: He is not diaphoretic.  Vitals reviewed.    UC Treatments / Results  Labs (all labs ordered are listed, but only abnormal results are displayed) Labs Reviewed - No data to display  EKG  EKG Interpretation None       Radiology Ct Head Wo Contrast  Result Date: 11/29/2016 CLINICAL DATA:  Status post fall.  Laceration over the left eyebrow EXAM: CT HEAD WITHOUT CONTRAST CT MAXILLOFACIAL WITHOUT CONTRAST TECHNIQUE: Multidetector CT imaging of the head and maxillofacial structures were performed using the standard protocol without intravenous contrast. Multiplanar CT image reconstructions of the maxillofacial structures were also generated. COMPARISON:  04/02/2015 FINDINGS: CT HEAD FINDINGS Brain: No evidence of acute infarction, hemorrhage, extra-axial  collection, ventriculomegaly, or mass effect. Generalized cerebral atrophy. Periventricular white matter low attenuation likely secondary to microangiopathy. Vascular: Cerebrovascular atherosclerotic calcifications are noted. Skull: Negative for fracture or focal lesion. Sinuses/Orbits: Visualized portions of the orbits are unremarkable. Visualized portions of the paranasal sinuses and mastoid air cells are unremarkable. Other: None. CT MAXILLOFACIAL FINDINGS Osseous: No fracture or mandibular dislocation.  No destructive process. 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 Orbits: Negative. No traumatic or inflammatory finding. Sinuses: Bilateral sinus surgery. Mild left maxillary sinus mucosal thickening. Mild left frontoethmoidal recess mucosal thickening. No sinus air-fluid level. Mastoid sinuses are clear. Soft tissues: Left supraorbital soft tissue swelling with high-density material likely reflecting a small hematoma. No fluid collection or other hematoma. IMPRESSION: 1. No acute intracranial pathology. 2. No acute osseous injury of the maxillofacial bones. Electronically Signed   By: Elige Ko   On: 11/29/2016 11:45   Ct Maxillofacial Wo Contrast  Result Date: 11/29/2016 CLINICAL DATA:  Status post fall.  Laceration over the left eyebrow EXAM: CT HEAD WITHOUT CONTRAST CT MAXILLOFACIAL WITHOUT CONTRAST TECHNIQUE: Multidetector CT imaging of the head and maxillofacial structures were performed using the standard protocol without intravenous contrast. Multiplanar CT image reconstructions of the maxillofacial structures were also generated. COMPARISON:  04/02/2015 FINDINGS: CT HEAD FINDINGS Brain: No evidence of acute infarction, hemorrhage, extra-axial collection, ventriculomegaly, or mass effect. Generalized cerebral atrophy. Periventricular white matter low attenuation likely secondary to microangiopathy. Vascular: Cerebrovascular atherosclerotic calcifications are noted. Skull: Negative  for fracture or focal lesion. Sinuses/Orbits: Visualized portions of the orbits are unremarkable. Visualized portions of the paranasal sinuses and mastoid air cells are unremarkable. Other: None. CT MAXILLOFACIAL FINDINGS Osseous: No fracture or mandibular dislocation. No destructive process. 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 Orbits: Negative. No traumatic or inflammatory finding. Sinuses: Bilateral sinus surgery. Mild left maxillary sinus mucosal thickening. Mild left frontoethmoidal recess mucosal thickening. No sinus air-fluid level. Mastoid sinuses are clear. Soft tissues: Left supraorbital soft tissue swelling with high-density material likely reflecting a small hematoma. No fluid collection or other hematoma. IMPRESSION: 1. No acute intracranial pathology. 2. No acute osseous injury of the maxillofacial bones. Electronically Signed   By: Elige Ko   On: 11/29/2016 11:45    Procedures .Marland KitchenLaceration Repair Date/Time: 11/29/2016 12:47 PM Performed by: Payton Mccallum Authorized by: Payton Mccallum   Consent:    Consent obtained:  Verbal   Consent given by:  Patient   Risks discussed:  Infection, poor cosmetic result, pain, retained foreign body, need for additional repair, nerve damage, vascular damage and poor wound healing   Alternatives discussed:  No treatment Anesthesia (see MAR for exact dosages):    Anesthesia method:  Local infiltration   Local anesthetic:  Lidocaine 1% w/o epi Laceration details:    Location:  Face   Face location:  L eyebrow   Length (cm):  2.4 Repair type:    Repair type:  Simple Pre-procedure details:    Preparation:  Patient was prepped and draped in usual sterile fashion Exploration:    Hemostasis achieved with:  Direct pressure   Wound exploration: wound explored through full range of motion     Wound extent: no foreign bodies/material noted, no muscle damage noted and no tendon damage noted     Contaminated: no   Treatment:     Area cleansed with:  Betadine   Amount of cleaning:  Standard   Irrigation solution:  Sterile saline   Irrigation method:  Syringe Skin repair:    Repair method:  Sutures   Suture size:  5-0   Suture material:  Nylon   Suture technique:  Simple interrupted   Number of sutures:  6 Approximation:    Approximation:  Close Post-procedure details:    Dressing:  Antibiotic ointment   Patient tolerance of procedure:  Tolerated well, no immediate complications   (including critical care  time)  Medications Ordered in UC Medications - No data to display   Initial Impression / Assessment and Plan / UC Course  I have reviewed the triage vital signs and the nursing notes.  Pertinent labs & imaging results that were available during my care of the patient were reviewed by me and considered in my medical decision making (see chart for details).       Final Clinical Impressions(s) / UC Diagnoses   Final diagnoses:  Laceration of left eyebrow, initial encounter  Injury of head, initial encounter  Fall, initial encounter  H/O syncope    New Prescriptions Discharge Medication List as of 11/29/2016 12:33 PM      1. x-ray results and diagnosis reviewed with patient 2. Laceration repair as above 3. Recommend supportive treatment with wound care ( verbal and written information given) 4. Notify patient's cardiologist about syncopal episode today 5. Follow-up in 8 days for suture removal or  prn if symptoms worsen or don't improve   Payton Mccallum, MD 11/29/16 1251

## 2016-11-29 NOTE — ED Triage Notes (Signed)
Patient's grandson states that his grandfather when he stood up felt lightheaded and fell and hit his face on the floor. Lucila MaineGrandson denies any other recent fall in the past 5 months. Grandson denies LOC.

## 2016-11-29 NOTE — Telephone Encounter (Signed)
Pt wife states pt had another spell where he passed out this morning. States pt started itching all over, and then "went down". States pt was hot to the touch. Pt has a pacer.

## 2016-11-29 NOTE — ED Triage Notes (Signed)
Patient has a laceration at his left eyebrow.  Patient also c/o right shoulder pain.

## 2016-11-29 NOTE — Telephone Encounter (Signed)
Spoke to wife about ppm. I asked her to send in a remote for review. Wife voiced understanding.

## 2016-11-29 NOTE — Telephone Encounter (Signed)
Remote transmission reviewed. Normal device function. No arrhythmias recorded. (2) rate drop episodes recorded. (1) from today during time of episode--HR 43bpm.   Wife states that patient began burping and felt "hot and itchy" prior to the syncopal episode. Wife states that patient feels fine at present. Will discuss episode with Dr.Klein and call patient with further recommendations.   I told wife that Dr. Graciela HusbandsKlein will not be in the office again until tomorrow pm, but I told her to encourage patient to lie flat if he begins to burp to avoid injuries. Again, wife voiced understanding.  Will forward information to Dr.Klein.

## 2016-11-30 NOTE — Telephone Encounter (Signed)
I spoke to Dr.Klein regarding patient sx's and rate drop episode. Dr.Klein recommended that patient lie flat when he notices the onset of the burping.  I called patient and spoke to wife Elnita MaxwellCheryl regarding Dr.Klein's recommendation. Elnita MaxwellCheryl verbalized understanding of Dr.Klein's instructions and said that she would inform her husband.

## 2016-12-01 ENCOUNTER — Other Ambulatory Visit: Payer: Self-pay | Admitting: Internal Medicine

## 2017-01-30 ENCOUNTER — Ambulatory Visit (INDEPENDENT_AMBULATORY_CARE_PROVIDER_SITE_OTHER): Payer: Medicare Other | Admitting: *Deleted

## 2017-01-30 DIAGNOSIS — I442 Atrioventricular block, complete: Secondary | ICD-10-CM | POA: Diagnosis not present

## 2017-01-30 NOTE — Progress Notes (Signed)
Remote pacemaker transmission.   

## 2017-01-31 ENCOUNTER — Encounter: Payer: Self-pay | Admitting: Cardiology

## 2017-02-01 LAB — CUP PACEART REMOTE DEVICE CHECK
Battery Remaining Longevity: 106 mo
Brady Statistic AP VP Percent: 0.14 %
Brady Statistic AP VS Percent: 0.08 %
Brady Statistic AS VP Percent: 0.07 %
Brady Statistic RA Percent Paced: 0.22 %
Brady Statistic RV Percent Paced: 0.19 %
Date Time Interrogation Session: 20180410134313
Implantable Lead Implant Date: 20160613
Implantable Lead Implant Date: 20160613
Implantable Lead Location: 753859
Implantable Lead Location: 753860
Implantable Lead Model: 5076
Implantable Lead Model: 5076
Lead Channel Impedance Value: 418 Ohm
Lead Channel Impedance Value: 437 Ohm
Lead Channel Pacing Threshold Pulse Width: 0.4 ms
Lead Channel Pacing Threshold Pulse Width: 0.4 ms
Lead Channel Sensing Intrinsic Amplitude: 6.125 mV
Lead Channel Sensing Intrinsic Amplitude: 6.125 mV
Lead Channel Setting Pacing Amplitude: 1.5 V
Lead Channel Setting Pacing Amplitude: 2 V
Lead Channel Setting Pacing Pulse Width: 0.4 ms
MDC IDC MSMT BATTERY VOLTAGE: 3.02 V
MDC IDC MSMT LEADCHNL RA IMPEDANCE VALUE: 494 Ohm
MDC IDC MSMT LEADCHNL RA PACING THRESHOLD AMPLITUDE: 0.5 V
MDC IDC MSMT LEADCHNL RA SENSING INTR AMPL: 2.5 mV
MDC IDC MSMT LEADCHNL RA SENSING INTR AMPL: 2.5 mV
MDC IDC MSMT LEADCHNL RV IMPEDANCE VALUE: 494 Ohm
MDC IDC MSMT LEADCHNL RV PACING THRESHOLD AMPLITUDE: 0.625 V
MDC IDC PG IMPLANT DT: 20160613
MDC IDC SET LEADCHNL RV SENSING SENSITIVITY: 1.2 mV
MDC IDC STAT BRADY AS VS PERCENT: 99.71 %

## 2017-05-01 ENCOUNTER — Ambulatory Visit (INDEPENDENT_AMBULATORY_CARE_PROVIDER_SITE_OTHER): Payer: Medicare Other | Admitting: *Deleted

## 2017-05-01 DIAGNOSIS — I442 Atrioventricular block, complete: Secondary | ICD-10-CM

## 2017-05-01 NOTE — Progress Notes (Signed)
Remote pacemaker transmission.   

## 2017-05-03 LAB — CUP PACEART REMOTE DEVICE CHECK
Battery Remaining Longevity: 99 mo
Battery Voltage: 3.01 V
Brady Statistic AS VP Percent: 0.05 %
Brady Statistic RA Percent Paced: 0.55 %
Date Time Interrogation Session: 20180710150551
Implantable Lead Implant Date: 20160613
Implantable Lead Location: 753860
Implantable Lead Model: 5076
Lead Channel Impedance Value: 399 Ohm
Lead Channel Pacing Threshold Amplitude: 0.5 V
Lead Channel Pacing Threshold Pulse Width: 0.4 ms
Lead Channel Pacing Threshold Pulse Width: 0.4 ms
Lead Channel Sensing Intrinsic Amplitude: 1.125 mV
Lead Channel Sensing Intrinsic Amplitude: 1.125 mV
Lead Channel Sensing Intrinsic Amplitude: 5.875 mV
MDC IDC LEAD IMPLANT DT: 20160613
MDC IDC LEAD LOCATION: 753859
MDC IDC MSMT LEADCHNL RA IMPEDANCE VALUE: 456 Ohm
MDC IDC MSMT LEADCHNL RV IMPEDANCE VALUE: 437 Ohm
MDC IDC MSMT LEADCHNL RV IMPEDANCE VALUE: 494 Ohm
MDC IDC MSMT LEADCHNL RV PACING THRESHOLD AMPLITUDE: 0.625 V
MDC IDC MSMT LEADCHNL RV SENSING INTR AMPL: 5.875 mV
MDC IDC PG IMPLANT DT: 20160613
MDC IDC SET LEADCHNL RA PACING AMPLITUDE: 1.5 V
MDC IDC SET LEADCHNL RV PACING AMPLITUDE: 2 V
MDC IDC SET LEADCHNL RV PACING PULSEWIDTH: 0.4 ms
MDC IDC SET LEADCHNL RV SENSING SENSITIVITY: 1.2 mV
MDC IDC STAT BRADY AP VP PERCENT: 0.41 %
MDC IDC STAT BRADY AP VS PERCENT: 0.14 %
MDC IDC STAT BRADY AS VS PERCENT: 99.4 %
MDC IDC STAT BRADY RV PERCENT PACED: 0.38 %

## 2017-05-07 ENCOUNTER — Encounter: Payer: Self-pay | Admitting: Cardiology

## 2017-05-29 ENCOUNTER — Ambulatory Visit (INDEPENDENT_AMBULATORY_CARE_PROVIDER_SITE_OTHER): Payer: Medicare Other | Admitting: Internal Medicine

## 2017-05-29 ENCOUNTER — Encounter: Payer: Self-pay | Admitting: Internal Medicine

## 2017-05-29 VITALS — BP 110/64 | HR 76 | Ht 67.0 in | Wt 146.8 lb

## 2017-05-29 DIAGNOSIS — Z950 Presence of cardiac pacemaker: Secondary | ICD-10-CM | POA: Diagnosis not present

## 2017-05-29 DIAGNOSIS — I442 Atrioventricular block, complete: Secondary | ICD-10-CM | POA: Diagnosis not present

## 2017-05-29 LAB — CUP PACEART INCLINIC DEVICE CHECK
Brady Statistic AP VP Percent: 0.21 %
Brady Statistic AS VP Percent: 0.05 %
Brady Statistic AS VS Percent: 99.66 %
Brady Statistic RV Percent Paced: 0.23 %
Date Time Interrogation Session: 20180807160924
Implantable Lead Implant Date: 20160613
Implantable Lead Implant Date: 20160613
Implantable Lead Location: 753860
Implantable Lead Model: 5076
Lead Channel Impedance Value: 399 Ohm
Lead Channel Impedance Value: 418 Ohm
Lead Channel Pacing Threshold Amplitude: 0.75 V
Lead Channel Pacing Threshold Pulse Width: 0.4 ms
Lead Channel Sensing Intrinsic Amplitude: 6.625 mV
Lead Channel Setting Pacing Amplitude: 1.5 V
MDC IDC LEAD LOCATION: 753859
MDC IDC MSMT BATTERY REMAINING LONGEVITY: 97 mo
MDC IDC MSMT BATTERY VOLTAGE: 3.02 V
MDC IDC MSMT LEADCHNL RA IMPEDANCE VALUE: 456 Ohm
MDC IDC MSMT LEADCHNL RA PACING THRESHOLD AMPLITUDE: 0.5 V
MDC IDC MSMT LEADCHNL RA PACING THRESHOLD PULSEWIDTH: 0.4 ms
MDC IDC MSMT LEADCHNL RA SENSING INTR AMPL: 2.75 mV
MDC IDC MSMT LEADCHNL RV IMPEDANCE VALUE: 494 Ohm
MDC IDC PG IMPLANT DT: 20160613
MDC IDC SET LEADCHNL RV PACING AMPLITUDE: 2 V
MDC IDC SET LEADCHNL RV PACING PULSEWIDTH: 0.4 ms
MDC IDC SET LEADCHNL RV SENSING SENSITIVITY: 1.2 mV
MDC IDC STAT BRADY AP VS PERCENT: 0.08 %
MDC IDC STAT BRADY RA PERCENT PACED: 0.28 %

## 2017-05-29 NOTE — Progress Notes (Signed)
Patient Care Team: Phillip Robinson, Phillip B III, MD as PCP - General (Internal Medicine)   HPI  Phillip Robinson is a 83 y.o. male seen in follow-up for pacemaker implanted 6/16 for adam stokes attacks in the context of underlying bifascicular block The patient denies chest pain, shortness of breath, nocturnal dyspnea, orthopnea or peripheral edema.  There have been no palpitations  No interval syncope.  His wife says he just has no energy. He is just weak. And as we tried to explore this, her comment was that she thinks she is just "given up". He reminds me that his son died 18 months ago from cancer. His brother died 8 months ago from cancer. He acknowledges that he just has not felt right since; there is a great deal of sadness.  Echocardiogram 6/16 normal LV function  Records and Results Reviewed  outside office records  Past Medical History:  Diagnosis Date  . Anxiety   . Arthritis   . Dysrhythmia    heart block  . GERD (gastroesophageal reflux disease)   . HOH (hard of hearing)   . Hyperlipidemia   . Hypertension   . Presence of permanent cardiac pacemaker 04/05/2015  . Syncope    a. 03/2015 echo: EF 55-60%, no rwma, Gr 1 DD, mild MR;  b. 03/2015 Event monitor w/ documented sinus arrest.    Past Surgical History:  Procedure Laterality Date  . CHOLECYSTECTOMY    . EP IMPLANTABLE DEVICE N/A 04/05/2015   Procedure: Pacemaker Implant;  Surgeon: Phillip SalviaSteven C Deshan Hemmelgarn, MD;  [facility];  Service: Cardiovascular;  Laterality: N/A;  . INSERT / REPLACE / REMOVE PACEMAKER  04/05/2015  . TOOTH EXTRACTION      Current Outpatient Prescriptions  Medication Sig Dispense Refill  . cetirizine (ZYRTEC) 10 MG tablet Take 10 mg by mouth daily as needed for allergies.     Marland Kitchen. esomeprazole (NEXIUM) 20 MG capsule Take 20 mg by mouth daily.     Marland Kitchen. LORazepam (ATIVAN) 0.5 MG tablet Take 0.5 mg by mouth every 6 (six) hours as needed for anxiety.     . simvastatin (ZOCOR) 40 MG tablet Take 40  mg by mouth daily at 6 PM.      No current facility-administered medications for this visit.     Allergies  Allergen Reactions  . Sulfa Antibiotics Other (See Comments)    Reaction:  Unknown       Review of Systems negative except from HPI and PMH  Physical Exam BP 110/64 (BP Location: Right Arm, Patient Position: Sitting, Cuff Size: Normal)   Pulse 76   Ht 5\' 7"  (1.702 m)   Wt 146 lb 12 oz (66.6 kg)   BMI 22.98 kg/m  Well developed and nourished in no acute distress HENT normal Neck supple with JVP-flat Carotids brisk and full without bruits Clear Regular rate and rhythm, no murmurs or gallops Abd-soft with active BS without hepatomegaly No Clubbing cyanosis edema Skin-warm and dry A & Oriented  Grossly normal sensory and motor function y  ECG demonstrates sinus rhythm at 78 17/13/42  Assessment and  Plan  Bifascicular block  Syncope  Intermittent complete heart block  Pacemaker  ,The patient's device was interrogated and the information was fully reviewed.  The device was reprogrammed to maximize longevity   Grief   No recurrent syncope and no orthostasis.  We discussed the loss and the pain. He is currently taking Ativan but his mood continues to worsen.  I will be in touch with Phillip Robinson to have him consider an antidepressant  We spent more than 50% of our >25 min visit in face to face counseling regarding the above

## 2017-05-29 NOTE — Patient Instructions (Signed)

## 2017-07-31 ENCOUNTER — Encounter: Payer: Medicare Other | Admitting: *Deleted

## 2017-07-31 ENCOUNTER — Telehealth: Payer: Self-pay | Admitting: Cardiology

## 2017-07-31 NOTE — Telephone Encounter (Signed)
Confirmed remote transmission w/ pt wife.   

## 2017-08-03 ENCOUNTER — Encounter: Payer: Self-pay | Admitting: Cardiology

## 2017-08-09 ENCOUNTER — Ambulatory Visit (INDEPENDENT_AMBULATORY_CARE_PROVIDER_SITE_OTHER): Payer: Medicare Other | Admitting: *Deleted

## 2017-08-09 DIAGNOSIS — I442 Atrioventricular block, complete: Secondary | ICD-10-CM

## 2017-08-10 ENCOUNTER — Telehealth: Payer: Self-pay | Admitting: Cardiology

## 2017-08-10 NOTE — Telephone Encounter (Signed)
Mr. Dierdre SearlesDoby verifying transmission was received last night. It was, next transmission scheduled for 11/08/17. He is appreciative of call.

## 2017-08-10 NOTE — Telephone Encounter (Signed)
New message:   Please call,received a letter from you.

## 2017-08-10 NOTE — Progress Notes (Signed)
Remote pacemaker transmission.   

## 2017-08-16 ENCOUNTER — Encounter: Payer: Self-pay | Admitting: Cardiology

## 2017-08-31 LAB — CUP PACEART REMOTE DEVICE CHECK
Date Time Interrogation Session: 20181109141447
Implantable Lead Implant Date: 20160613
Implantable Lead Location: 753860
Implantable Lead Model: 5076
Implantable Lead Model: 5076
Implantable Pulse Generator Implant Date: 20160613
MDC IDC LEAD IMPLANT DT: 20160613
MDC IDC LEAD LOCATION: 753859
MDC IDC SET LEADCHNL RA PACING AMPLITUDE: 1.5 V
MDC IDC SET LEADCHNL RV PACING AMPLITUDE: 2 V
MDC IDC SET LEADCHNL RV PACING PULSEWIDTH: 0.4 ms
MDC IDC SET LEADCHNL RV SENSING SENSITIVITY: 1.2 mV

## 2017-09-04 ENCOUNTER — Other Ambulatory Visit: Payer: Self-pay | Admitting: Internal Medicine

## 2017-11-08 ENCOUNTER — Encounter: Payer: Medicare Other | Admitting: *Deleted

## 2017-11-08 ENCOUNTER — Telehealth: Payer: Self-pay | Admitting: Cardiology

## 2017-11-08 NOTE — Telephone Encounter (Signed)
Confirmed remote transmission w/ pt wife.   

## 2017-11-09 ENCOUNTER — Encounter: Payer: Self-pay | Admitting: Cardiology

## 2017-11-29 ENCOUNTER — Ambulatory Visit (INDEPENDENT_AMBULATORY_CARE_PROVIDER_SITE_OTHER): Payer: Medicare Other | Admitting: *Deleted

## 2017-11-29 DIAGNOSIS — I442 Atrioventricular block, complete: Secondary | ICD-10-CM | POA: Diagnosis not present

## 2017-11-29 NOTE — Progress Notes (Signed)
Remote pacemaker transmission.   

## 2017-12-05 ENCOUNTER — Encounter: Payer: Self-pay | Admitting: Cardiology

## 2017-12-21 LAB — CUP PACEART REMOTE DEVICE CHECK
Brady Statistic AP VP Percent: 0.24 %
Brady Statistic AP VS Percent: 0.07 %
Brady Statistic AS VP Percent: 0.05 %
Brady Statistic RA Percent Paced: 0.31 %
Implantable Lead Implant Date: 20160613
Implantable Lead Location: 753859
Implantable Lead Model: 5076
Implantable Lead Model: 5076
Implantable Pulse Generator Implant Date: 20160613
Lead Channel Impedance Value: 380 Ohm
Lead Channel Impedance Value: 418 Ohm
Lead Channel Pacing Threshold Pulse Width: 0.4 ms
Lead Channel Pacing Threshold Pulse Width: 0.4 ms
Lead Channel Sensing Intrinsic Amplitude: 1.625 mV
Lead Channel Sensing Intrinsic Amplitude: 5.625 mV
Lead Channel Sensing Intrinsic Amplitude: 5.625 mV
Lead Channel Setting Pacing Amplitude: 1.5 V
Lead Channel Setting Pacing Amplitude: 2 V
Lead Channel Setting Pacing Pulse Width: 0.4 ms
MDC IDC LEAD IMPLANT DT: 20160613
MDC IDC LEAD LOCATION: 753860
MDC IDC MSMT BATTERY REMAINING LONGEVITY: 100 mo
MDC IDC MSMT BATTERY VOLTAGE: 3.02 V
MDC IDC MSMT LEADCHNL RA IMPEDANCE VALUE: 418 Ohm
MDC IDC MSMT LEADCHNL RA PACING THRESHOLD AMPLITUDE: 0.5 V
MDC IDC MSMT LEADCHNL RA SENSING INTR AMPL: 1.625 mV
MDC IDC MSMT LEADCHNL RV IMPEDANCE VALUE: 475 Ohm
MDC IDC MSMT LEADCHNL RV PACING THRESHOLD AMPLITUDE: 0.625 V
MDC IDC SESS DTM: 20190207163951
MDC IDC SET LEADCHNL RV SENSING SENSITIVITY: 1.2 mV
MDC IDC STAT BRADY AS VS PERCENT: 99.64 %
MDC IDC STAT BRADY RV PERCENT PACED: 0.25 %

## 2018-02-28 ENCOUNTER — Ambulatory Visit (INDEPENDENT_AMBULATORY_CARE_PROVIDER_SITE_OTHER): Payer: Medicare Other | Admitting: *Deleted

## 2018-02-28 ENCOUNTER — Telehealth: Payer: Self-pay | Admitting: Cardiology

## 2018-02-28 DIAGNOSIS — I442 Atrioventricular block, complete: Secondary | ICD-10-CM | POA: Diagnosis not present

## 2018-02-28 NOTE — Telephone Encounter (Signed)
Confirmed remote transmission w/ pt wife.   

## 2018-03-04 NOTE — Progress Notes (Signed)
Remote pacemaker transmission.   

## 2018-03-05 ENCOUNTER — Encounter: Payer: Self-pay | Admitting: Cardiology

## 2018-03-26 LAB — CUP PACEART REMOTE DEVICE CHECK
Battery Remaining Longevity: 98 mo
Battery Voltage: 3.02 V
Brady Statistic AP VP Percent: 0.05 %
Brady Statistic AS VS Percent: 99.9 %
Brady Statistic RA Percent Paced: 0.06 %
Brady Statistic RV Percent Paced: 0.09 %
Date Time Interrogation Session: 20190509215014
Implantable Lead Implant Date: 20160613
Implantable Lead Implant Date: 20160613
Implantable Lead Location: 753860
Implantable Pulse Generator Implant Date: 20160613
Lead Channel Impedance Value: 418 Ohm
Lead Channel Impedance Value: 475 Ohm
Lead Channel Pacing Threshold Amplitude: 0.5 V
Lead Channel Pacing Threshold Amplitude: 0.625 V
Lead Channel Pacing Threshold Pulse Width: 0.4 ms
Lead Channel Pacing Threshold Pulse Width: 0.4 ms
Lead Channel Sensing Intrinsic Amplitude: 1.625 mV
Lead Channel Setting Pacing Amplitude: 1.5 V
Lead Channel Setting Sensing Sensitivity: 1.2 mV
MDC IDC LEAD LOCATION: 753859
MDC IDC MSMT LEADCHNL RA IMPEDANCE VALUE: 361 Ohm
MDC IDC MSMT LEADCHNL RA IMPEDANCE VALUE: 418 Ohm
MDC IDC MSMT LEADCHNL RA SENSING INTR AMPL: 1.625 mV
MDC IDC MSMT LEADCHNL RV SENSING INTR AMPL: 5.125 mV
MDC IDC MSMT LEADCHNL RV SENSING INTR AMPL: 5.125 mV
MDC IDC SET LEADCHNL RV PACING AMPLITUDE: 2 V
MDC IDC SET LEADCHNL RV PACING PULSEWIDTH: 0.4 ms
MDC IDC STAT BRADY AP VS PERCENT: 0.01 %
MDC IDC STAT BRADY AS VP PERCENT: 0.04 %

## 2018-05-30 ENCOUNTER — Ambulatory Visit (INDEPENDENT_AMBULATORY_CARE_PROVIDER_SITE_OTHER): Payer: Medicare Other | Admitting: *Deleted

## 2018-05-30 DIAGNOSIS — I442 Atrioventricular block, complete: Secondary | ICD-10-CM | POA: Diagnosis not present

## 2018-05-31 NOTE — Progress Notes (Signed)
Remote pacemaker transmission.   

## 2018-06-19 LAB — CUP PACEART REMOTE DEVICE CHECK
Battery Voltage: 3.01 V
Brady Statistic AP VP Percent: 0.05 %
Brady Statistic AS VP Percent: 0.04 %
Brady Statistic AS VS Percent: 99.91 %
Implantable Lead Implant Date: 20160613
Implantable Lead Location: 753860
Implantable Lead Model: 5076
Lead Channel Impedance Value: 380 Ohm
Lead Channel Impedance Value: 418 Ohm
Lead Channel Impedance Value: 418 Ohm
Lead Channel Pacing Threshold Amplitude: 0.5 V
Lead Channel Pacing Threshold Amplitude: 0.625 V
Lead Channel Pacing Threshold Pulse Width: 0.4 ms
Lead Channel Pacing Threshold Pulse Width: 0.4 ms
Lead Channel Sensing Intrinsic Amplitude: 2.125 mV
Lead Channel Sensing Intrinsic Amplitude: 5.5 mV
Lead Channel Setting Pacing Amplitude: 1.5 V
MDC IDC LEAD IMPLANT DT: 20160613
MDC IDC LEAD LOCATION: 753859
MDC IDC MSMT BATTERY REMAINING LONGEVITY: 89 mo
MDC IDC MSMT LEADCHNL RA SENSING INTR AMPL: 2.125 mV
MDC IDC MSMT LEADCHNL RV IMPEDANCE VALUE: 475 Ohm
MDC IDC MSMT LEADCHNL RV SENSING INTR AMPL: 5.5 mV
MDC IDC PG IMPLANT DT: 20160613
MDC IDC SESS DTM: 20190808233217
MDC IDC SET LEADCHNL RV PACING AMPLITUDE: 2 V
MDC IDC SET LEADCHNL RV PACING PULSEWIDTH: 0.4 ms
MDC IDC SET LEADCHNL RV SENSING SENSITIVITY: 1.2 mV
MDC IDC STAT BRADY AP VS PERCENT: 0.01 %
MDC IDC STAT BRADY RA PERCENT PACED: 0.05 %
MDC IDC STAT BRADY RV PERCENT PACED: 0.08 %

## 2018-08-29 ENCOUNTER — Telehealth: Payer: Self-pay

## 2018-08-29 ENCOUNTER — Ambulatory Visit (INDEPENDENT_AMBULATORY_CARE_PROVIDER_SITE_OTHER): Payer: Medicare Other | Admitting: *Deleted

## 2018-08-29 DIAGNOSIS — I442 Atrioventricular block, complete: Secondary | ICD-10-CM | POA: Diagnosis not present

## 2018-08-29 NOTE — Telephone Encounter (Signed)
LMOVM reminding pt to send remote transmission.   

## 2018-09-01 ENCOUNTER — Encounter: Payer: Self-pay | Admitting: Cardiology

## 2018-09-01 NOTE — Progress Notes (Signed)
Remote pacemaker transmission.   

## 2018-11-01 LAB — CUP PACEART REMOTE DEVICE CHECK
Battery Voltage: 3.01 V
Brady Statistic AP VP Percent: 0.07 %
Brady Statistic AS VP Percent: 0.03 %
Brady Statistic AS VS Percent: 99.89 %
Date Time Interrogation Session: 20191108154726
Implantable Lead Implant Date: 20160613
Implantable Lead Implant Date: 20160613
Implantable Lead Location: 753860
Lead Channel Impedance Value: 380 Ohm
Lead Channel Impedance Value: 399 Ohm
Lead Channel Impedance Value: 418 Ohm
Lead Channel Pacing Threshold Amplitude: 0.5 V
Lead Channel Pacing Threshold Amplitude: 0.75 V
Lead Channel Pacing Threshold Pulse Width: 0.4 ms
Lead Channel Pacing Threshold Pulse Width: 0.4 ms
Lead Channel Sensing Intrinsic Amplitude: 1.5 mV
Lead Channel Sensing Intrinsic Amplitude: 5.5 mV
Lead Channel Setting Pacing Amplitude: 1.5 V
Lead Channel Setting Sensing Sensitivity: 1.2 mV
MDC IDC LEAD LOCATION: 753859
MDC IDC MSMT BATTERY REMAINING LONGEVITY: 87 mo
MDC IDC MSMT LEADCHNL RA SENSING INTR AMPL: 1.5 mV
MDC IDC MSMT LEADCHNL RV IMPEDANCE VALUE: 456 Ohm
MDC IDC MSMT LEADCHNL RV SENSING INTR AMPL: 5.5 mV
MDC IDC PG IMPLANT DT: 20160613
MDC IDC SET LEADCHNL RV PACING AMPLITUDE: 2 V
MDC IDC SET LEADCHNL RV PACING PULSEWIDTH: 0.4 ms
MDC IDC STAT BRADY AP VS PERCENT: 0.01 %
MDC IDC STAT BRADY RA PERCENT PACED: 0.08 %
MDC IDC STAT BRADY RV PERCENT PACED: 0.1 %

## 2018-11-28 ENCOUNTER — Ambulatory Visit: Payer: Medicare Other

## 2018-11-29 ENCOUNTER — Ambulatory Visit (INDEPENDENT_AMBULATORY_CARE_PROVIDER_SITE_OTHER): Payer: Medicare Other

## 2018-11-29 DIAGNOSIS — I442 Atrioventricular block, complete: Secondary | ICD-10-CM | POA: Diagnosis not present

## 2018-11-30 LAB — CUP PACEART REMOTE DEVICE CHECK
Battery Voltage: 3.01 V
Brady Statistic AP VP Percent: 0.03 %
Brady Statistic AP VS Percent: 0.01 %
Brady Statistic AS VP Percent: 0.03 %
Brady Statistic AS VS Percent: 99.93 %
Brady Statistic RV Percent Paced: 0.05 %
Implantable Lead Implant Date: 20160613
Implantable Lead Implant Date: 20160613
Implantable Lead Location: 753859
Implantable Lead Location: 753860
Implantable Lead Model: 5076
Lead Channel Impedance Value: 399 Ohm
Lead Channel Impedance Value: 418 Ohm
Lead Channel Impedance Value: 456 Ohm
Lead Channel Pacing Threshold Amplitude: 0.5 V
Lead Channel Pacing Threshold Amplitude: 0.625 V
Lead Channel Pacing Threshold Pulse Width: 0.4 ms
Lead Channel Sensing Intrinsic Amplitude: 0.875 mV
Lead Channel Sensing Intrinsic Amplitude: 5.375 mV
Lead Channel Setting Pacing Amplitude: 1.5 V
Lead Channel Setting Pacing Amplitude: 2 V
Lead Channel Setting Pacing Pulse Width: 0.4 ms
MDC IDC MSMT BATTERY REMAINING LONGEVITY: 86 mo
MDC IDC MSMT LEADCHNL RA PACING THRESHOLD PULSEWIDTH: 0.4 ms
MDC IDC MSMT LEADCHNL RA SENSING INTR AMPL: 0.875 mV
MDC IDC MSMT LEADCHNL RV IMPEDANCE VALUE: 475 Ohm
MDC IDC MSMT LEADCHNL RV SENSING INTR AMPL: 5.375 mV
MDC IDC PG IMPLANT DT: 20160613
MDC IDC SESS DTM: 20200207161617
MDC IDC SET LEADCHNL RV SENSING SENSITIVITY: 1.2 mV
MDC IDC STAT BRADY RA PERCENT PACED: 0.04 %

## 2018-12-10 NOTE — Progress Notes (Signed)
Remote pacemaker transmission.   

## 2019-03-07 ENCOUNTER — Ambulatory Visit (INDEPENDENT_AMBULATORY_CARE_PROVIDER_SITE_OTHER): Payer: Medicare Other | Admitting: *Deleted

## 2019-03-07 ENCOUNTER — Other Ambulatory Visit: Payer: Self-pay

## 2019-03-07 ENCOUNTER — Telehealth: Payer: Self-pay

## 2019-03-07 DIAGNOSIS — I442 Atrioventricular block, complete: Secondary | ICD-10-CM

## 2019-03-07 NOTE — Telephone Encounter (Signed)
Spoke with patient to remind of missed remote transmission 

## 2019-03-10 ENCOUNTER — Encounter: Payer: Self-pay | Admitting: Cardiology

## 2019-03-10 LAB — CUP PACEART REMOTE DEVICE CHECK
Battery Remaining Longevity: 86 mo
Battery Voltage: 3.01 V
Brady Statistic AP VP Percent: 0.08 %
Brady Statistic AP VS Percent: 0.01 %
Brady Statistic AS VP Percent: 0.03 %
Brady Statistic AS VS Percent: 99.88 %
Brady Statistic RA Percent Paced: 0.09 %
Brady Statistic RV Percent Paced: 0.11 %
Date Time Interrogation Session: 20200516012126
Implantable Lead Implant Date: 20160613
Implantable Lead Implant Date: 20160613
Implantable Lead Location: 753859
Implantable Lead Location: 753860
Implantable Lead Model: 5076
Implantable Lead Model: 5076
Implantable Pulse Generator Implant Date: 20160613
Lead Channel Impedance Value: 380 Ohm
Lead Channel Impedance Value: 418 Ohm
Lead Channel Impedance Value: 456 Ohm
Lead Channel Impedance Value: 475 Ohm
Lead Channel Pacing Threshold Amplitude: 0.5 V
Lead Channel Pacing Threshold Amplitude: 0.625 V
Lead Channel Pacing Threshold Pulse Width: 0.4 ms
Lead Channel Pacing Threshold Pulse Width: 0.4 ms
Lead Channel Sensing Intrinsic Amplitude: 1.125 mV
Lead Channel Sensing Intrinsic Amplitude: 1.125 mV
Lead Channel Sensing Intrinsic Amplitude: 5.125 mV
Lead Channel Sensing Intrinsic Amplitude: 5.125 mV
Lead Channel Setting Pacing Amplitude: 1.5 V
Lead Channel Setting Pacing Amplitude: 2 V
Lead Channel Setting Pacing Pulse Width: 0.4 ms
Lead Channel Setting Sensing Sensitivity: 1.2 mV

## 2019-03-10 NOTE — Progress Notes (Signed)
Remote pacemaker transmission.   

## 2019-06-06 ENCOUNTER — Ambulatory Visit: Payer: Medicare Other | Admitting: *Deleted

## 2019-06-13 NOTE — Progress Notes (Signed)
Remote pacemaker transmission.   

## 2019-06-20 ENCOUNTER — Ambulatory Visit (INDEPENDENT_AMBULATORY_CARE_PROVIDER_SITE_OTHER): Payer: Medicare Other | Admitting: *Deleted

## 2019-06-20 DIAGNOSIS — I442 Atrioventricular block, complete: Secondary | ICD-10-CM | POA: Diagnosis not present

## 2019-06-21 LAB — CUP PACEART REMOTE DEVICE CHECK
Battery Remaining Longevity: 86 mo
Battery Voltage: 3.01 V
Brady Statistic AP VP Percent: 0.07 %
Brady Statistic AP VS Percent: 0.01 %
Brady Statistic AS VP Percent: 0.03 %
Brady Statistic AS VS Percent: 99.89 %
Brady Statistic RA Percent Paced: 0.08 %
Brady Statistic RV Percent Paced: 0.09 %
Date Time Interrogation Session: 20200828225307
Implantable Lead Implant Date: 20160613
Implantable Lead Implant Date: 20160613
Implantable Lead Location: 753859
Implantable Lead Location: 753860
Implantable Lead Model: 5076
Implantable Lead Model: 5076
Implantable Pulse Generator Implant Date: 20160613
Lead Channel Impedance Value: 380 Ohm
Lead Channel Impedance Value: 418 Ohm
Lead Channel Impedance Value: 456 Ohm
Lead Channel Impedance Value: 475 Ohm
Lead Channel Pacing Threshold Amplitude: 0.625 V
Lead Channel Pacing Threshold Amplitude: 0.625 V
Lead Channel Pacing Threshold Pulse Width: 0.4 ms
Lead Channel Pacing Threshold Pulse Width: 0.4 ms
Lead Channel Sensing Intrinsic Amplitude: 2.125 mV
Lead Channel Sensing Intrinsic Amplitude: 2.125 mV
Lead Channel Sensing Intrinsic Amplitude: 4.875 mV
Lead Channel Sensing Intrinsic Amplitude: 4.875 mV
Lead Channel Setting Pacing Amplitude: 1.5 V
Lead Channel Setting Pacing Amplitude: 2 V
Lead Channel Setting Pacing Pulse Width: 0.4 ms
Lead Channel Setting Sensing Sensitivity: 1.2 mV

## 2019-06-24 ENCOUNTER — Encounter: Payer: Self-pay | Admitting: Cardiology

## 2019-06-24 NOTE — Progress Notes (Signed)
Remote pacemaker transmission.   

## 2019-09-22 ENCOUNTER — Ambulatory Visit (INDEPENDENT_AMBULATORY_CARE_PROVIDER_SITE_OTHER): Payer: Medicare Other | Admitting: *Deleted

## 2019-09-22 DIAGNOSIS — I442 Atrioventricular block, complete: Secondary | ICD-10-CM

## 2019-09-23 LAB — CUP PACEART REMOTE DEVICE CHECK
Battery Remaining Longevity: 77 mo
Battery Voltage: 3.01 V
Brady Statistic AP VP Percent: 0.03 %
Brady Statistic AP VS Percent: 0.03 %
Brady Statistic AS VP Percent: 0.02 %
Brady Statistic AS VS Percent: 99.91 %
Brady Statistic RA Percent Paced: 0.06 %
Brady Statistic RV Percent Paced: 0.06 %
Date Time Interrogation Session: 20201201093926
Implantable Lead Implant Date: 20160613
Implantable Lead Implant Date: 20160613
Implantable Lead Location: 753859
Implantable Lead Location: 753860
Implantable Lead Model: 5076
Implantable Lead Model: 5076
Implantable Pulse Generator Implant Date: 20160613
Lead Channel Impedance Value: 380 Ohm
Lead Channel Impedance Value: 418 Ohm
Lead Channel Impedance Value: 456 Ohm
Lead Channel Impedance Value: 475 Ohm
Lead Channel Pacing Threshold Amplitude: 0.625 V
Lead Channel Pacing Threshold Amplitude: 0.625 V
Lead Channel Pacing Threshold Pulse Width: 0.4 ms
Lead Channel Pacing Threshold Pulse Width: 0.4 ms
Lead Channel Sensing Intrinsic Amplitude: 2.25 mV
Lead Channel Sensing Intrinsic Amplitude: 2.25 mV
Lead Channel Sensing Intrinsic Amplitude: 5 mV
Lead Channel Sensing Intrinsic Amplitude: 5 mV
Lead Channel Setting Pacing Amplitude: 1.5 V
Lead Channel Setting Pacing Amplitude: 2 V
Lead Channel Setting Pacing Pulse Width: 0.4 ms
Lead Channel Setting Sensing Sensitivity: 1.2 mV

## 2019-11-21 ENCOUNTER — Telehealth: Payer: Self-pay | Admitting: Internal Medicine

## 2019-11-21 NOTE — Telephone Encounter (Signed)
Wife of patient called. Patient passed away last week, and she just wanted to know if she needed to return the patient's heart monitor or not. Please call her and let her know.

## 2019-11-21 NOTE — Telephone Encounter (Signed)
I let the pt wife know I will order a return kit. I verified her address. I let her know we are sorry for her loss. She thanked me for my help.

## 2019-11-24 DEATH — deceased
